# Patient Record
Sex: Female | Born: 2000 | Race: Black or African American | Hispanic: No | Marital: Single | Smoking: Never smoker
Health system: Southern US, Community
[De-identification: ages and names within clinical notes are randomized; demographics above are authoritative.]

## PROBLEM LIST (undated history)

## (undated) ENCOUNTER — Inpatient Hospital Stay (HOSPITAL_COMMUNITY): Payer: BLUE CROSS/BLUE SHIELD

## (undated) ENCOUNTER — Ambulatory Visit (HOSPITAL_COMMUNITY): Source: Home / Self Care

## (undated) DIAGNOSIS — Z973 Presence of spectacles and contact lenses: Secondary | ICD-10-CM

## (undated) DIAGNOSIS — R102 Pelvic and perineal pain unspecified side: Secondary | ICD-10-CM

## (undated) HISTORY — PX: APPENDECTOMY: SHX54

---
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
## 2021-01-09 ENCOUNTER — Emergency Department (HOSPITAL_COMMUNITY): Payer: BC Managed Care – PPO

## 2021-01-09 ENCOUNTER — Other Ambulatory Visit: Payer: Self-pay

## 2021-01-09 ENCOUNTER — Emergency Department (HOSPITAL_COMMUNITY)
Admission: EM | Admit: 2021-01-09 | Discharge: 2021-01-09 | Disposition: A | Discharge status: Home / Self Care | Payer: BC Managed Care – PPO | Attending: Emergency Medicine | Admitting: Emergency Medicine

## 2021-01-09 ENCOUNTER — Encounter (HOSPITAL_COMMUNITY): Payer: Self-pay

## 2021-01-09 DIAGNOSIS — R1110 Vomiting, unspecified: Secondary | ICD-10-CM | POA: Diagnosis not present

## 2021-01-09 DIAGNOSIS — R1084 Generalized abdominal pain: Secondary | ICD-10-CM | POA: Insufficient documentation

## 2021-01-09 DIAGNOSIS — R109 Unspecified abdominal pain: Secondary | ICD-10-CM | POA: Diagnosis not present

## 2021-01-09 DIAGNOSIS — R112 Nausea with vomiting, unspecified: Secondary | ICD-10-CM | POA: Insufficient documentation

## 2021-01-09 LAB — CBC
HCT: 40.6 % (ref 36.0–46.0)
Hemoglobin: 13.2 g/dL (ref 12.0–15.0)
MCH: 27.7 pg (ref 26.0–34.0)
MCHC: 32.5 g/dL (ref 30.0–36.0)
MCV: 85.1 fL (ref 80.0–100.0)
Platelets: 181 10*3/uL (ref 150–400)
RBC: 4.77 MIL/uL (ref 3.87–5.11)
RDW: 14.5 % (ref 11.5–15.5)
WBC: 9.4 10*3/uL (ref 4.0–10.5)
nRBC: 0 % (ref 0.0–0.2)

## 2021-01-09 LAB — URINALYSIS, ROUTINE W REFLEX MICROSCOPIC
Bacteria, UA: NONE SEEN
Bilirubin Urine: NEGATIVE
Glucose, UA: NEGATIVE mg/dL
Hgb urine dipstick: NEGATIVE
Ketones, ur: 20 mg/dL — AB
Leukocytes,Ua: NEGATIVE
Nitrite: NEGATIVE
Protein, ur: 30 mg/dL — AB
Specific Gravity, Urine: 1.03 (ref 1.005–1.030)
pH: 6 (ref 5.0–8.0)

## 2021-01-09 LAB — WET PREP, GENITAL
Sperm: NONE SEEN
Trich, Wet Prep: NONE SEEN
Yeast Wet Prep HPF POC: NONE SEEN

## 2021-01-09 LAB — COMPREHENSIVE METABOLIC PANEL
ALT: 25 U/L (ref 0–44)
AST: 24 U/L (ref 15–41)
Albumin: 4.9 g/dL (ref 3.5–5.0)
Alkaline Phosphatase: 71 U/L (ref 38–126)
Anion gap: 11 (ref 5–15)
BUN: 9 mg/dL (ref 6–20)
CO2: 26 mmol/L (ref 22–32)
Calcium: 10.1 mg/dL (ref 8.9–10.3)
Chloride: 105 mmol/L (ref 98–111)
Creatinine, Ser: 0.82 mg/dL (ref 0.44–1.00)
GFR, Estimated: >60 mL/min (ref >60)
Glucose, Bld: 114 mg/dL — ABNORMAL HIGH (ref 70–99)
Potassium: 3.1 mmol/L — ABNORMAL LOW (ref 3.5–5.1)
Sodium: 142 mmol/L (ref 135–145)
Total Bilirubin: 0.7 mg/dL (ref 0.3–1.2)
Total Protein: 9.1 g/dL — ABNORMAL HIGH (ref 6.5–8.1)

## 2021-01-09 LAB — I-STAT BETA HCG BLOOD, ED (MC, WL, AP ONLY): I-stat hCG, quantitative: <5 m[IU]/mL (ref <5)

## 2021-01-09 LAB — LIPASE, BLOOD: Lipase: 49 U/L (ref 11–51)

## 2021-01-09 MED ORDER — POTASSIUM CHLORIDE CRYS ER 20 MEQ PO TBCR: 20.0000 meq | EXTENDED_RELEASE_TABLET | Freq: Two times a day (BID) | ORAL | 0 refills | Status: DC

## 2021-01-09 MED ORDER — ONDANSETRON HCL 4 MG/2ML IJ SOLN
4.0000 mg | Freq: Once | INTRAMUSCULAR | Status: AC
  Administered 2021-01-09: 4 mg via INTRAVENOUS
  Filled 2021-01-09: qty 2

## 2021-01-09 MED ORDER — SODIUM CHLORIDE 0.9 % IV SOLN
12.5000 mg | Freq: Four times a day (QID) | INTRAVENOUS | Status: DC | PRN
  Administered 2021-01-09: 12.5 mg via INTRAVENOUS
  Filled 2021-01-09: qty 12.5

## 2021-01-09 MED ORDER — NAPROXEN 500 MG PO TABS: 500.0000 mg | ORAL_TABLET | Freq: Two times a day (BID) | ORAL | 0 refills | Status: DC

## 2021-01-09 MED ORDER — SODIUM CHLORIDE 0.9 % IV BOLUS
1000.0000 mL | Freq: Once | INTRAVENOUS | Status: AC
  Administered 2021-01-09: 1000 mL via INTRAVENOUS

## 2021-01-09 MED ORDER — FENTANYL CITRATE (PF) 100 MCG/2ML IJ SOLN
50.0000 ug | Freq: Once | INTRAMUSCULAR | Status: AC
  Administered 2021-01-09: 50 ug via INTRAVENOUS
  Filled 2021-01-09: qty 2

## 2021-01-09 MED ORDER — SODIUM CHLORIDE (PF) 0.9 % IJ SOLN
INTRAMUSCULAR | Status: AC
  Filled 2021-01-09: qty 50

## 2021-01-09 MED ORDER — KETOROLAC TROMETHAMINE 30 MG/ML IJ SOLN
30.0000 mg | Freq: Once | INTRAMUSCULAR | Status: AC
  Administered 2021-01-09: 30 mg via INTRAVENOUS
  Filled 2021-01-09: qty 1

## 2021-01-09 MED ORDER — ONDANSETRON HCL 4 MG PO TABS: 4.0000 mg | ORAL_TABLET | Freq: Four times a day (QID) | ORAL | 0 refills | Status: DC

## 2021-01-09 MED ORDER — IOHEXOL 300 MG/ML  SOLN
100.0000 mL | Freq: Once | INTRAMUSCULAR | Status: AC | PRN
  Administered 2021-01-09: 100 mL via INTRAVENOUS

## 2021-01-09 MED ORDER — LEVONORGESTREL-ETHINYL ESTRAD 0.1-20 MG-MCG PO TABS: 1.0000 | ORAL_TABLET | Freq: Every day | ORAL | 11 refills | Status: DC

## 2021-01-09 NOTE — Discharge Instructions (Addendum)
You were seen today in the emergency department for abdominal pain.    During your work-up, it was discovered that you have low potassium levels.  Although I do not believe that is causing your abdominal pain, I would like you to take potassium supplements for the next 4 days.  Please take 20 mg twice daily for 4 days.    Additionally, for your menstrual pain you can take Motrin as needed.  Please discuss your symptoms with your OB/GYN at your appointment next month.  I have written you a prescription for short of 1 month of oral birth control pills.  Please take them once daily within the same hour every day.  As we discussed, this increases your chance of developing blood clots although that is a very limited increased risk.  Moreover birth control pills may also cause some breast tenderness, menstrual irregularities.   I also have written your prescription for Zofran.  This is a medicine you can take every 6 hours as needed for nausea and vomiting symptoms.  Please take naproxen twice daily for the next 5 days.  Please take it with food and water as this can cause some stomach upset.  Your ultrasound was negative for gallstones.

## 2021-01-09 NOTE — ED Triage Notes (Addendum)
Patient states she has noted that she has vomiting and abdominal pain when her period starts. Patient states that she just finished her period on June 18. Patient states she is now having mid abdominal pain RLQ pain today. Patient states she has never had the pain in the RLQ. Patient also reports multiple episodes of vomiting today.

## 2021-01-09 NOTE — ED Provider Notes (Signed)
[facility]-EMERGENCY DEPT Provider Note   CSN: [id number] Arrival date & time: 01/09/21  1145     History Chief Complaint  Patient presents with   Abdominal Pain   Emesis    Sarah Cabrera is a 19 y.o. female with pertinent past medical history of appendectomy that presents to the emergency department today for abdominal pain and nausea vomiting for the last two moths, worse over the last several days. Patient states that she started having severe nausea vomiting every time her period starts for the past 4 cycles.  Patient states that her cycles last about 5 to 6 days, denies any heavy bleeding.  Patient states that she goes through about 6 tampons in a day.  Patient states that she ended her menstrual cycle on the 18th of this month, however still having the nausea vomiting and abdominal pain.  Patient states that she normally has a nausea vomiting abdominal pain during and after her menstrual cycle for the last 2 months.  She is not currently on her menstrual cycle.   States that normally when she has this pain is generalized in her abdomen, today she says that she started having right lower quadrant pain for 2 seconds..  States that right lower quadrant pain has gone away, only lasted 2 seconds, was sharp.  Did not radiate anywhere.  No vaginal bleeding.  Patient has had an appendectomy before.  States that she is currently on her period, has never had an ultrasound.  Denies any GYN history.  Patient denies any vaginal discharge or pelvic pain.  States that she has not been able to keep anything down besides smoothies over the past 3 days.  Patient states that she has been having 2 menstrual cycles a month for the past couple months.  Is not on any birth control.  Denies any diarrhea, fevers, chest pain shortness of breath, headache or myalgias.  Denies any alcohol or substance use.  HPI     History reviewed. No pertinent past medical history.  There are no problems to  display for this patient.   Past Surgical History:  Procedure Laterality Date   APPENDECTOMY       OB History   No obstetric history on file.     Family History  Problem Relation Age of Onset   Healthy Mother    Cancer Father     Social History   Tobacco Use   Smoking status: Never   Smokeless tobacco: Never  Vaping Use   Vaping Use: Never used  Substance Use Topics   Alcohol use: Never   Drug use: Yes    Types: Marijuana    Home Medications Prior to Admission medications   Not on File    Allergies    Patient has no known allergies.  Review of Systems   Review of Systems  Constitutional:  Negative for chills, diaphoresis, fatigue and fever.  HENT:  Negative for congestion, sore throat and trouble swallowing.   Eyes:  Negative for pain and visual disturbance.  Respiratory:  Negative for cough, shortness of breath and wheezing.   Cardiovascular:  Negative for chest pain, palpitations and leg swelling.  Gastrointestinal:  Positive for abdominal pain, nausea and vomiting. Negative for abdominal distention and diarrhea.  Genitourinary:  Negative for difficulty urinating.  Musculoskeletal:  Negative for back pain, neck pain and neck stiffness.  Skin:  Negative for pallor.  Neurological:  Negative for dizziness, speech difficulty, weakness and headaches.  Psychiatric/Behavioral:  Negative  for confusion.    Physical Exam Updated Vital Signs BP (!) 139/110   Pulse 85   Temp 98.4 F (36.9 C) (Oral)   Resp 18   Ht 5\' 6"  (1.676 m)   Wt 70.8 kg   LMP 01/03/2021   SpO2 100%   BMI 25.18 kg/m   Physical Exam Constitutional:      General: She is not in acute distress.    Appearance: Normal appearance. She is not ill-appearing, toxic-appearing or diaphoretic.     Comments: Patient appears very well, no vomiting  HENT:     Mouth/Throat:     Mouth: Mucous membranes are moist.     Pharynx: Oropharynx is clear.  Eyes:     General: No scleral icterus.     Extraocular Movements: Extraocular movements intact.     Pupils: Pupils are equal, round, and reactive to light.  Cardiovascular:     Rate and Rhythm: Normal rate and regular rhythm.     Pulses: Normal pulses.     Heart sounds: Normal heart sounds.  Pulmonary:     Effort: Pulmonary effort is normal. No respiratory distress.     Breath sounds: Normal breath sounds. No stridor. No wheezing, rhonchi or rales.  Chest:     Chest wall: No tenderness.  Abdominal:     General: Abdomen is flat. There is no distension.     Palpations: Abdomen is soft.     Tenderness: There is generalized abdominal tenderness. There is no guarding or rebound.  Genitourinary:    Comments: Chaperone present.  Normal external vaginal exam.  Normal internal vaginal exam with normal vaginal canal, cervix closed, no vaginal discharge or vaginal bleeding.  Nonfriable cervix.  Bimanual exam with no adnexal or cervical motion tenderness. Musculoskeletal:        General: No swelling or tenderness. Normal range of motion.     Cervical back: Normal range of motion and neck supple. No rigidity.     Right lower leg: No edema.     Left lower leg: No edema.  Skin:    General: Skin is warm and dry.     Capillary Refill: Capillary refill takes less than 2 seconds.     Coloration: Skin is not pale.  Neurological:     General: No focal deficit present.     Mental Status: She is alert and oriented to person, place, and time.  Psychiatric:        Mood and Affect: Mood normal.        Behavior: Behavior normal.    ED Results / Procedures / Treatments   Labs (all labs ordered are listed, but only abnormal results are displayed) Labs Reviewed  WET PREP, GENITAL - Abnormal; Notable for the following components:      Result Value   Clue Cells Wet Prep HPF POC PRESENT (*)    WBC, Wet Prep HPF POC RARE (*)    All other components within normal limits  COMPREHENSIVE METABOLIC PANEL - Abnormal; Notable for the following components:    Potassium 3.1 (*)    Glucose, Bld 114 (*)    Total Protein 9.1 (*)    All other components within normal limits  URINALYSIS, ROUTINE W REFLEX MICROSCOPIC - Abnormal; Notable for the following components:   Color, Urine AMBER (*)    APPearance HAZY (*)    Ketones, ur 20 (*)    Protein, ur 30 (*)    All other components within normal limits  LIPASE, BLOOD  CBC  I-STAT BETA HCG BLOOD, ED ([facility], [facility], [facility] ONLY)  GC/CHLAMYDIA PROBE AMP ([place]) NOT AT [facility]    EKG None  Radiology No results found.  Procedures Procedures   Medications Ordered in ED Medications  promethazine (PHENERGAN) 12.5 mg in sodium chloride 0.9 % 50 mL IVPB (has no administration in time range)  sodium chloride 0.9 % bolus 1,000 mL (1,000 mLs Intravenous New Bag/Given 01/09/21 1416)  ondansetron (ZOFRAN) injection 4 mg (4 mg Intravenous Given 01/09/21 1417)  fentaNYL (SUBLIMAZE) injection 50 mcg (50 mcg Intravenous Given 01/09/21 1547)    ED Course  I have reviewed the triage vital signs and the nursing notes.  Pertinent labs & imaging results that were available during my care of the patient were reviewed by me and considered in my medical decision making (see chart for details).    MDM Rules/Calculators/A&P                         Patient presents to the emerge department today for nausea vomiting abdominal pain.  Patient appears well, no right lower quadrant tenderness.  Low suspicion for ovarian torsion since pain only lasted a couple seconds and patient is not having any pain anymore.  Patient's pain was primarily generalized throughout abdomen.  Was having generalized abdominal pain nausea vomiting prior to right lower quadrant pain.  Patient is not having vaginal bleeding with normal pelvic exam, no concerns for PID.  Patient appears very well, is not actively vomiting.  Differential to include endometriosis, pain related to menstrual cycle, urolithiasis?  I think CT is reasonable at this time since this  has been ongoing for 2 months.   Work-up today shows CMP with potassium of 3.1, will repeat this once patient is able to tolerate p.o.  Lipase normal.  CBC without any leukocytosis or anemia.  I-STAT beta-hCG negative.  Urinalysis without any blood.  Pt care was handed off to H. Sage PA-C at 330.  Complete history and physical and current plan have been communicated.  Please refer to their note for the remainder of ED care and ultimate disposition.  Plan is for patient to obtain CT abdomen pelvis.  Also awaiting wet prep results.  Consider ultrasound if right lower quadrant pain returns?  if this is negative patient can be discharged if she is tolerating p.o.  When patient is able to tolerate p.o. potassium should be repleted.  I would also prescribe potassium for the next couple of days.  Patient will need to follow-up with GYN most likely.   Final Clinical Impression(s) / ED Diagnoses Final diagnoses:  Intractable vomiting with nausea, unspecified vomiting type    Rx / DC Orders ED Discharge Orders     None        Farrel Gordon, PA-C 01/09/21 1555    Alvira Monday, MD 01/09/21 2146

## 2021-01-09 NOTE — ED Provider Notes (Signed)
Care of patient is assumed from [facility] at shift change - please see her note for full details. She is presenting for RLQ pain, S/P appendectomy. Pain appears related to her menstrual cycles. She is not on birth control. CT abdomen/pelvis pending.   Physical Exam  BP (!) 139/110   Pulse 85   Temp 98.4 F (36.9 C) (Oral)   Resp 18   Ht 5\' 6"  (1.676 m)   Wt 70.8 kg   LMP 01/03/2021   SpO2 100%   BMI 25.18 kg/m   Physical Exam Vitals and nursing note reviewed. Exam conducted with a chaperone present.  Constitutional:      General: She is not in acute distress.    Appearance: Normal appearance.     Comments: Patient tearful.   HENT:     Head: Normocephalic and atraumatic.  Eyes:     General: No scleral icterus.    Extraocular Movements: Extraocular movements intact.     Pupils: Pupils are equal, round, and reactive to light.  Abdominal:     Tenderness: There is generalized abdominal tenderness. There is no guarding.  Skin:    Coloration: Skin is not jaundiced.  Neurological:     Mental Status: She is alert. Mental status is at baseline.     Coordination: Coordination normal.    ED Course/Procedures   Clinical Course as of 01/09/21 1905  Fri Jan 09, 2021  1707 Patient passed PO test. She reports pain has returned and is requesting pain medicine. [HS]  1808 Pain returned. Given that pain is insighted by eating/drinking I have ordered ultrasound to assess for gallstones.  I doubt cholecystitis given no fever, no elevated white count, no specific right upper quadrant pain.  However, could be cholelithiasis given intermittent nature of the pain.  If this results negative patient will be appropriate for discharge. [HS]  1855 Jan 11, 2021 Abdomen Limited No cholelithiasis  [HS]    Clinical Course User Index [HS] [place], PA-C    Procedures  MDM  Patient is a 20 y.o s/p appendectomy presenting for diffuse abdominal pain. Workup has been reassuring. No elevated white count  concerning for infectious etiology, no anemia. Lipase low, doubt pancreatitis. Not UTI based on U. GC/Chlamydia pending. Beta hCG low, doubt ectopic pregnancy. Wet prep notable for clue cells. Based on pelvic, not PID. I suspect this is menstrual cramping pain vs. Endometriosis vs. Ovarian cyst. Will give patient referral to OBGYN. She has an appointment with her PCP in July. Discussed starting her on birth control and she desires to start. I discussed risks, side effects, and efficacy with her. She is agreeable to starting it.   She reports having a sudden onset of more pain after eating and drinking.  She states that although her pain is primarily related to her menstrual cycles, she does seem to have increased pain, nausea, vomiting after eating and drinking.  She does have diffuse abdominal tenderness, so I have lower suspicion for Coley lithiasis.  She also does not have a fever or elevated white count concerning for cholecystitis.  However, patient and her mother were very concerned about her pain level so I will order the limited ultrasound.  Depending on this result, I suspect she will be able to be discharged home with follow-up.  She is hypokalemic to 3.1, will treat out patient with oral k-dur.   Right upper quadrant ultrasound ordered to evaluate for cholecystitis.  It resulted negative for cholelithiasis.  Patient will work-up today, I will  discharge patient with follow-up.  She already has an appoint with an OBG next month.  We discussed return precautions and patient is agreeable to discharge.  Her pain is resolved while in the ED today.   Theron Arista, PA-C 01/09/21 1907    Little, Ambrose [place], MD 01/10/21 [phone]

## 2021-01-10 ENCOUNTER — Emergency Department (HOSPITAL_COMMUNITY)
Admission: EM | Admit: 2021-01-10 | Discharge: 2021-01-10 | Disposition: A | Discharge status: Home / Self Care | Payer: BC Managed Care – PPO | Attending: Emergency Medicine | Admitting: Emergency Medicine

## 2021-01-10 ENCOUNTER — Encounter (HOSPITAL_COMMUNITY): Payer: Self-pay | Admitting: Emergency Medicine

## 2021-01-10 ENCOUNTER — Emergency Department (HOSPITAL_COMMUNITY): Payer: BC Managed Care – PPO

## 2021-01-10 DIAGNOSIS — E876 Hypokalemia: Secondary | ICD-10-CM | POA: Insufficient documentation

## 2021-01-10 DIAGNOSIS — R102 Pelvic and perineal pain: Secondary | ICD-10-CM | POA: Diagnosis not present

## 2021-01-10 DIAGNOSIS — R112 Nausea with vomiting, unspecified: Secondary | ICD-10-CM | POA: Insufficient documentation

## 2021-01-10 DIAGNOSIS — N760 Acute vaginitis: Secondary | ICD-10-CM | POA: Diagnosis not present

## 2021-01-10 DIAGNOSIS — R109 Unspecified abdominal pain: Secondary | ICD-10-CM | POA: Diagnosis not present

## 2021-01-10 DIAGNOSIS — B9689 Other specified bacterial agents as the cause of diseases classified elsewhere: Secondary | ICD-10-CM | POA: Insufficient documentation

## 2021-01-10 LAB — CBC WITH DIFFERENTIAL/PLATELET
Abs Immature Granulocytes: 0.03 10*3/uL (ref 0.00–0.07)
Basophils Absolute: 0 10*3/uL (ref 0.0–0.1)
Basophils Relative: 0 %
Eosinophils Absolute: 0 10*3/uL (ref 0.0–0.5)
Eosinophils Relative: 0 %
HCT: 37.4 % (ref 36.0–46.0)
Hemoglobin: 12.1 g/dL (ref 12.0–15.0)
Immature Granulocytes: 0 %
Lymphocytes Relative: 19 %
Lymphs Abs: 2.1 10*3/uL (ref 0.7–4.0)
MCH: 27.3 pg (ref 26.0–34.0)
MCHC: 32.4 g/dL (ref 30.0–36.0)
MCV: 84.2 fL (ref 80.0–100.0)
Monocytes Absolute: 0.7 10*3/uL (ref 0.1–1.0)
Monocytes Relative: 6 %
Neutro Abs: 8.4 10*3/uL — ABNORMAL HIGH (ref 1.7–7.7)
Neutrophils Relative %: 75 %
Platelets: 166 10*3/uL (ref 150–400)
RBC: 4.44 MIL/uL (ref 3.87–5.11)
RDW: 14.3 % (ref 11.5–15.5)
WBC: 11.4 10*3/uL — ABNORMAL HIGH (ref 4.0–10.5)
nRBC: 0 % (ref 0.0–0.2)

## 2021-01-10 LAB — COMPREHENSIVE METABOLIC PANEL
ALT: 22 U/L (ref 0–44)
AST: 23 U/L (ref 15–41)
Albumin: 4 g/dL (ref 3.5–5.0)
Alkaline Phosphatase: 63 U/L (ref 38–126)
Anion gap: 13 (ref 5–15)
BUN: 8 mg/dL (ref 6–20)
CO2: 20 mmol/L — ABNORMAL LOW (ref 22–32)
Calcium: 9.6 mg/dL (ref 8.9–10.3)
Chloride: 106 mmol/L (ref 98–111)
Creatinine, Ser: 0.96 mg/dL (ref 0.44–1.00)
GFR, Estimated: >60 mL/min (ref >60)
Glucose, Bld: 91 mg/dL (ref 70–99)
Potassium: 3.3 mmol/L — ABNORMAL LOW (ref 3.5–5.1)
Sodium: 139 mmol/L (ref 135–145)
Total Bilirubin: 0.8 mg/dL (ref 0.3–1.2)
Total Protein: 7.6 g/dL (ref 6.5–8.1)

## 2021-01-10 LAB — I-STAT BETA HCG BLOOD, ED (MC, WL, AP ONLY): I-stat hCG, quantitative: <5 m[IU]/mL (ref <5)

## 2021-01-10 LAB — LIPASE, BLOOD: Lipase: 53 U/L — ABNORMAL HIGH (ref 11–51)

## 2021-01-10 MED ORDER — PROMETHAZINE HCL 25 MG RE SUPP: 25.0000 mg | Freq: Four times a day (QID) | RECTAL | 0 refills | Status: DC | PRN

## 2021-01-10 MED ORDER — SODIUM CHLORIDE 0.9 % IV BOLUS
1000.0000 mL | Freq: Once | INTRAVENOUS | Status: AC
  Administered 2021-01-10: 1000 mL via INTRAVENOUS

## 2021-01-10 MED ORDER — METRONIDAZOLE 500 MG PO TABS: 500.0000 mg | ORAL_TABLET | Freq: Two times a day (BID) | ORAL | 0 refills | Status: DC

## 2021-01-10 MED ORDER — MORPHINE SULFATE (PF) 4 MG/ML IV SOLN: 4.0000 mg | Freq: Once | INTRAVENOUS | Status: DC

## 2021-01-10 MED ORDER — ONDANSETRON 4 MG PO TBDP: 4.0000 mg | ORAL_TABLET | Freq: Three times a day (TID) | ORAL | 0 refills | Status: DC | PRN

## 2021-01-10 MED ORDER — MORPHINE SULFATE (PF) 4 MG/ML IV SOLN
4.0000 mg | Freq: Once | INTRAVENOUS | Status: AC
  Administered 2021-01-10: 4 mg via INTRAVENOUS
  Filled 2021-01-10: qty 1

## 2021-01-10 MED ORDER — ONDANSETRON HCL 4 MG/2ML IJ SOLN
4.0000 mg | Freq: Once | INTRAMUSCULAR | Status: AC
  Administered 2021-01-10: 4 mg via INTRAVENOUS
  Filled 2021-01-10: qty 2

## 2021-01-10 MED ORDER — DROPERIDOL 2.5 MG/ML IJ SOLN: 2.5000 mg | Freq: Once | INTRAMUSCULAR | Status: DC

## 2021-01-10 MED ORDER — KETOROLAC TROMETHAMINE 30 MG/ML IJ SOLN
15.0000 mg | Freq: Once | INTRAMUSCULAR | Status: AC
  Administered 2021-01-10: 15 mg via INTRAVENOUS
  Filled 2021-01-10: qty 1

## 2021-01-10 NOTE — ED Notes (Signed)
Pt tolerated PO challenge

## 2021-01-10 NOTE — ED Notes (Signed)
Pt reports ongoing lower abd pain described as cramping pain and light she has to have a BM. Last period 6/12 ending on the 18th. Pt appears uncomfortable in the bed. Pt reports not being able to keep food down and that when she is able, it makes the pain worse. Pt endorses smoking weed 2 days ago.

## 2021-01-10 NOTE — Discharge Instructions (Addendum)
Abdominal pain, Nausea, and Vomiting  Hand washing: Wash your hands throughout the day, but especially before and after touching the face, using the restroom, sneezing, coughing, or touching surfaces that have been coughed or sneezed upon. Hydration: Symptoms will be intensified and complicated by dehydration. Dehydration can also extend the duration of symptoms. Drink plenty of fluids and get plenty of rest. You should be drinking at least half a liter of water an hour to stay hydrated. Electrolyte drinks (ex. Gatorade, Powerade, Pedialyte) are also encouraged. You should be drinking enough fluids to make your urine light yellow, almost clear. If this is not the case, you are not drinking enough water. Please note that some of the treatments indicated below will not be effective if you are not adequately hydrated. Diet: Please concentrate on hydration, however, you may introduce food slowly.  Start with a clear liquid diet, progressed to a full liquid diet, and then bland solids as you are able. Pain or fever: Ibuprofen, Naproxen, or Tylenol for pain.  Do this regimen now, but may also start this regimen just prior to onset of your menstrual cycle. Antiinflammatory medications: Take 600 mg of ibuprofen every 6 hours or 440 mg (over the counter dose) to 500 mg (prescription dose) of naproxen every 12 hours for the next 3 days. After this time, these medications may be used as needed for pain. Take these medications with food to avoid upset stomach. Choose only one of these medications, do not take them together. Acetaminophen (generic for Tylenol): Should you continue to have additional pain while taking the ibuprofen or naproxen, you may add in acetaminophen as needed. Your daily total maximum amount of acetaminophen from all sources should be limited to 4000mg /day for persons without liver problems, or 2000mg /day for those with liver problems. Nausea/vomiting: Use the ondansetron (generic for Zofran) for  nausea or vomiting.  This medication may not prevent all vomiting or nausea, but can help facilitate better hydration. Things that can help with nausea/vomiting also include peppermint/menthol candies, vitamin B12, and ginger. Nausea/vomiting: As an alternative to the ondansetron, you may use the promethazine (generic for Phenergan) for nausea or vomiting.  This medication may not prevent all vomiting or nausea, but can help facilitate better hydration. Things that can help with nausea/vomiting also include peppermint/menthol candies, vitamin B12, and ginger. Follow-up: The highest yield may be with OB/GYN follow-up.  Call to make an appointment. Return: Return should you develop a fever, bloody diarrhea, increased abdominal pain, vaginal bleeding soaking more than 1 pad an hour, uncontrolled vomiting, or any other major concerns.  For prescription assistance, may try using prescription discount sites or apps, such as goodrx.com

## 2021-01-10 NOTE — ED Notes (Signed)
Pt transported to [facility] at this time. 

## 2021-01-10 NOTE — ED Notes (Signed)
Reviewed discharge instructions with patient and father. Follow-up care, discharge instructions and medications reviewed. Patient and father verbalized understanding. Patient A&Ox4, VSS, and ambulatory with steady gait upon discharge.

## 2021-01-10 NOTE — ED Triage Notes (Signed)
C/o mid abdominal pain with nausea, vomiting, and constipation since yesterday.  Seen in ED at [facility] yesterday for same.

## 2021-01-10 NOTE — ED Notes (Signed)
Pt screaming and crying. Pt's father came to nurses station asking for pain meds. Notified provider.

## 2021-01-10 NOTE — ED Provider Notes (Signed)
[facility] EMERGENCY DEPARTMENT Provider Note   CSN: [id number] Arrival date & time: 01/10/21  0755    History Abd pain, emesis   Sarah Cabrera is a 20 y.o. female with no significant past medical history who presents for evaluation of nausea, vomiting and abdominal pain.  Seen yesterday for similar complaints.  Had CT scan as well as ultrasound right upper quadrant.  Patient states pain is generalized however worse at suprapubic region. No vag discharge. Has never been sexually active. No dysuria, hematuria. Sent home yesterday with Naproxen, potassium and Zofran. Continues to have multiple episodes of NBNB emesis. She feels constipated. Unsure last BM however states when the last time she went it was only a "few little balls." No melena or BRBPR. Typically does get emesis with menstrual cycles however last LMP 01/03/21. No EtOH, chronic NSAID use.  She does admit to frequent marijuana use, last used 2 days ago. No additional illicit substances. Rates her pain a 9/10.  Last episode of emesis 1 hour PTA.  Prior abd surgeries>> Appy at age 58. No hx of SBO.   Patient here with Father in room. Gives permission to obtain HPI, exam and discuss results with family in room.  History obtained from patient, family in room and past medical records. No interpretor was used.  HPI     History reviewed. No pertinent past medical history.  There are no problems to display for this patient.   Past Surgical History:  Procedure Laterality Date   APPENDECTOMY       OB History   No obstetric history on file.     Family History  Problem Relation Age of Onset   Healthy Mother    Cancer Father     Social History   Tobacco Use   Smoking status: Never   Smokeless tobacco: Never  Vaping Use   Vaping Use: Never used  Substance Use Topics   Alcohol use: Never   Drug use: Yes    Types: Marijuana    Home Medications Prior to Admission medications   Medication Sig  Start Date End Date Taking? Authorizing Provider  metroNIDAZOLE (FLAGYL) 500 MG tablet Take 1 tablet (500 mg total) by mouth 2 (two) times daily. 01/10/21  Yes Normagene Harvie A, PA-C  naproxen (NAPROSYN) 500 MG tablet Take 1 tablet (500 mg total) by mouth 2 (two) times daily. 01/09/21  Yes Theron Arista, PA-C  ondansetron (ZOFRAN) 4 MG tablet Take 1 tablet (4 mg total) by mouth every 6 (six) hours. 01/09/21  Yes Theron Arista, PA-C  potassium chloride SA (KLOR-CON) 20 MEQ tablet Take 1 tablet (20 mEq total) by mouth 2 (two) times daily. 01/09/21  Yes Theron Arista, PA-C  levonorgestrel-ethinyl estradiol (ALESSE) 0.1-20 MG-MCG tablet Take 1 tablet by mouth daily. Patient not taking: No sig reported 01/09/21   Theron Arista, PA-C    Allergies    Patient has no known allergies.  Review of Systems   Review of Systems  Constitutional: Negative.   HENT: Negative.    Respiratory: Negative.    Cardiovascular: Negative.   Gastrointestinal:  Positive for abdominal pain, constipation, nausea and vomiting. Negative for abdominal distention, anal bleeding, blood in stool, diarrhea and rectal pain.  Genitourinary: Negative.   Musculoskeletal: Negative.   Skin: Negative.   Neurological: Negative.   All other systems reviewed and are negative.  Physical Exam Updated Vital Signs BP 134/81   Pulse 67   Temp 98.2 F (36.8 C) (Oral)   Resp  15   LMP 12/28/2020 (Exact Date)   SpO2 100%   Physical Exam Vitals and nursing note reviewed.  Constitutional:      General: She is not in acute distress.    Appearance: She is well-developed. She is not ill-appearing, toxic-appearing or diaphoretic.     Comments: Tearful in room  HENT:     Head: Normocephalic and atraumatic.     Mouth/Throat:     Mouth: Mucous membranes are moist.  Eyes:     Pupils: Pupils are equal, round, and reactive to light.  Cardiovascular:     Rate and Rhythm: Normal rate.     Pulses: Normal pulses.     Heart sounds: Normal heart  sounds.  Pulmonary:     Effort: Pulmonary effort is normal. No respiratory distress.     Breath sounds: Normal breath sounds.  Abdominal:     General: There is no distension.     Tenderness: There is abdominal tenderness in the suprapubic area.     Hernia: No hernia is present.     Comments: Diffuse tenderness to abdomen worse at suprapubic region  Musculoskeletal:        General: Normal range of motion.     Cervical back: Normal range of motion.  Skin:    General: Skin is warm and dry.  Neurological:     General: No focal deficit present.     Mental Status: She is alert.  Psychiatric:        Mood and Affect: Mood normal.    ED Results / Procedures / Treatments   Labs (all labs ordered are listed, but only abnormal results are displayed) Labs Reviewed  CBC WITH DIFFERENTIAL/PLATELET - Abnormal; Notable for the following components:      Result Value   WBC 11.4 (*)    Neutro Abs 8.4 (*)    All other components within normal limits  COMPREHENSIVE METABOLIC PANEL - Abnormal; Notable for the following components:   Potassium 3.3 (*)    CO2 20 (*)    All other components within normal limits  LIPASE, BLOOD - Abnormal; Notable for the following components:   Lipase 53 (*)    All other components within normal limits  URINE CULTURE  URINALYSIS, ROUTINE W REFLEX MICROSCOPIC  RAPID URINE DRUG SCREEN, HOSP PERFORMED  I-STAT BETA HCG BLOOD, ED ([facility], [facility], [facility] ONLY)    EKG None  Radiology CT Abdomen Pelvis W Contrast  Result Date: 01/09/2021 CLINICAL DATA:  Right lower quadrant abdominal pain.  Vomiting. EXAM: CT ABDOMEN AND PELVIS WITH CONTRAST TECHNIQUE: Multidetector CT imaging of the abdomen and pelvis was performed using the standard protocol following bolus administration of intravenous contrast. CONTRAST:  100mL OMNIPAQUE IOHEXOL 300 MG/ML  SOLN COMPARISON:  None. FINDINGS: Lower chest: Clear lung bases. Hepatobiliary: No focal liver abnormality is seen. No gallstones,  gallbladder wall thickening, or biliary dilatation. Pancreas: Unremarkable. No pancreatic ductal dilatation or surrounding inflammatory changes. Spleen: Normal in size without focal abnormality. Adrenals/Urinary Tract: Adrenal glands are unremarkable. Kidneys are normal, without renal calculi, focal lesion, or hydronephrosis. Bladder is unremarkable. Stomach/Bowel: Normal stomach. Small bowel and colon are normal in caliber. No wall thickening. No inflammation. Appendix not discretely seen. No findings to suggest acute appendicitis. Vascular/Lymphatic: No significant vascular findings are present. No enlarged abdominal or pelvic lymph nodes. Reproductive: Uterus and bilateral adnexa are unremarkable. Other: None. Musculoskeletal: No acute or significant osseous findings. IMPRESSION: 1. Normal enhanced CT scan of the abdomen pelvis. No findings to account  for the patient's pain. Normal appendix not discretely seen, but there is no evidence to suggest acute appendicitis. Electronically Signed   By: Amie Portland M.D.   On: 01/09/2021 16:48   US Abdomen Limited  Result Date: 01/09/2021 CLINICAL DATA:  Nausea vomiting after eating EXAM: ULTRASOUND ABDOMEN LIMITED RIGHT UPPER QUADRANT COMPARISON:  None. FINDINGS: Gallbladder: No gallstones or wall thickening visualized. No sonographic Murphy sign noted by sonographer. Common bile duct: Diameter: 3 mm Liver: No focal lesion identified. Within normal limits in parenchymal echogenicity. Portal vein is patent on color Doppler imaging with normal direction of blood flow towards the liver. Other: None. IMPRESSION: Negative right upper quadrant abdominal ultrasound Electronically Signed   By: Jasmine Pang M.D.   On: 01/09/2021 18:51   DG Abd Portable 1V  Result Date: 01/10/2021 CLINICAL DATA:  Severe abdominal pain and vomiting for 2 days EXAM: PORTABLE ABDOMEN - 1 VIEW COMPARISON:  None. FINDINGS: The bowel gas pattern is normal. No radio-opaque calculi or other  significant radiographic abnormality are seen. IMPRESSION: Nonobstructive pattern of bowel gas. No free air in the abdomen on supine radiograph. Electronically Signed   By: Lauralyn Primes M.D.   On: 01/10/2021 13:39   US PELVIC COMPLETE W TRANSVAGINAL AND TORSION R/O  Result Date: 01/10/2021 CLINICAL DATA:  Bilateral pelvic pain for 1 month. EXAM: TRANSABDOMINAL AND TRANSVAGINAL ULTRASOUND OF PELVIS DOPPLER ULTRASOUND OF OVARIES TECHNIQUE: Both transabdominal and transvaginal ultrasound examinations of the pelvis were performed. Transabdominal technique was performed for global imaging of the pelvis including uterus, ovaries, adnexal regions, and pelvic cul-de-sac. It was necessary to proceed with endovaginal exam following the transabdominal exam to visualize the LEFT and RIGHT ovary. Color and duplex Doppler ultrasound was utilized to evaluate blood flow to the ovaries. COMPARISON:  CT abdomen and pelvis from January 09, 2021. FINDINGS: Uterus Measurements: 8.0 x 4.2 x 4.0 cm = volume: 70.9 mL. Retroverted uterus without focal abnormality. Endometrium Thickness: 12 mm.  No focal abnormality visualized. Right ovary Measurements: 3.3 x 1.6 x 2.6 cm = volume: 7.3 mL. Normal appearance/no adnexal mass. Left ovary Measurements: 2.6 x 2.0 x 1.9 cm = volume: 5.1 mL. Normal appearance/no adnexal mass. Pulsed Doppler evaluation of both ovaries demonstrates normal low-resistance arterial and venous waveforms. Other findings Trace free pelvic fluid. IMPRESSION: Unremarkable pelvic sonogram without signs of ovarian torsion. Electronically Signed   By: Donzetta Kohut M.D.   On: 01/10/2021 13:07    Procedures Procedures   Medications Ordered in ED Medications  droperidol (INAPSINE) 2.5 MG/ML injection 2.5 mg (2.5 mg Intramuscular Not Given 01/10/21 1512)  sodium chloride 0.9 % bolus 1,000 mL (0 mLs Intravenous Stopped 01/10/21 1131)  ondansetron (ZOFRAN) injection 4 mg (4 mg Intravenous Given 01/10/21 0924)  morphine 4  MG/ML injection 4 mg (4 mg Intravenous Given 01/10/21 0272)    ED Course  I have reviewed the triage vital signs and the nursing notes.  Pertinent labs & imaging results that were available during my care of the patient were reviewed by me and considered in my medical decision making (see chart for details).  Here for evaluation of N/V/ and abd pain. Seen yesterday for similar complaints.  I reviewed her CT AP and RUQ [place] from yesterday which did not show any significant abnormality.  Had some mild hypokalemia sent home on potassium supplementation.  Continued pain despite naproxen and Zofran at home.  Patient very tearful in room.  She has diffuse abdominal tenderness however worse to suprapubic region.  Notes from yesterday showed pelvic exam without CMT or adnexal tenderness. Wet prep with BV>>> per chart review not sent home on Flagyl. Will need abx at dc.  Plan to repeat labs, treat symptomatically, obtain ultrasound, xray abd and reassess.  Labs and imaging personally reviewed and interpreted:  0850: Patient was take to [place] prior to IVF and pain meds given. Unfortunately not able to obtain imaging due to empty bladder, pain and persistent emesis. Nursing made aware, will be given previously ordered meds and let us known when able to complete the imaging.  1020: Patient with continued pain, multiple episodes of NBNB emesis. Droperidol ordered.  1200: Patient reassessed. Sleeping. Nursing had not given Droperidol yet as patient was sleeping on reassessment. Will plan on [place] and hold droperidol unless pain, emesis returns.  Ultrasound negative for acute process, no torsion.  Declined repeat GU exam as states she had this yesterday. X-ray negative for SBO, no constipation.  1500: Patient reassessed. Has not needed any additional pain medicine or antiemetics since her initial dose.  She was sleeping on reassessment arousable to voice.  States she feels much improved.  Abdomen soft, nontender without  rebound or guarding.  Plan on p.o. challenge.  Care transferred to [facility], PA-C who will follow up on p.o. challenge, reassessment and determine dipo.  If patient still has emesis after PO challenge would suggest additional antiemetic, reevaluate abdominal exam.  If still has persistent emesis, intractable pain would recommend admission at that time.      MDM Rules/Calculators/A&P                           Final Clinical Impression(s) / ED Diagnoses Final diagnoses:  Pelvic pain  Bacterial vaginosis    Rx / DC Orders ED Discharge Orders          Ordered    metroNIDAZOLE (FLAGYL) 500 MG tablet  2 times daily        01/10/21 0826             Brenn Deziel A, PA-C 01/10/21 1540    Jacalyn Lefevre, MD 01/11/21 [phone]

## 2021-01-10 NOTE — ED Notes (Signed)
Pt sleeping at this time.

## 2021-01-10 NOTE — ED Provider Notes (Signed)
Sarah Cabrera is a 20 y.o. female, presenting to the ED with suprapubic abdominal pain intermittent over the last couple months, accompanied by nausea and vomiting. Patient states the pain seems to begin surrounding her menstrual cycles and nausea/vomiting follows the pain.  She endorses cramping in the suprapubic region, intermittent, moderate to severe, nonradiating.  Patient and her father at the bedside detail patient has not tried any interventions or medications for her symptoms other than 1 dose of naproxen she took this morning and then vomited shortly thereafter.  Via phone, mother details family history of fibroids.   HPI from [facility], PA-C: "Sarah Cabrera is a 20 y.o. female with no significant past medical history who presents for evaluation of nausea, vomiting and abdominal pain.  Seen yesterday for similar complaints.  Had CT scan as well as ultrasound right upper quadrant.  Patient states pain is generalized however worse at suprapubic region. No vag discharge. Has never been sexually active. No dysuria, hematuria. Sent home yesterday with Naproxen, potassium and Zofran. Continues to have multiple episodes of NBNB emesis. She feels constipated. Unsure last BM however states when the last time she went it was only a "few little balls." No melena or BRBPR. Typically does get emesis with menstrual cycles however last LMP 01/03/21. No EtOH, chronic NSAID use.  She does admit to frequent marijuana use, last used 2 days ago. No additional illicit substances. Rates her pain a 9/10.   Last episode of emesis 1 hour PTA.   Prior abd surgeries>> Appy at age 29. No hx of SBO.     Patient here with Father in room. Gives permission to obtain HPI, exam and discuss results with family in room."  History reviewed. No pertinent past medical history.  Physical Exam  BP 113/77   Pulse 61   Temp 98.2 F (36.8 C) (Oral)   Resp 14   LMP 12/28/2020 (Exact Date)   SpO2 100%   Physical  Exam Vitals and nursing note reviewed.  Constitutional:      General: She is not in acute distress.    Appearance: She is well-developed. She is not diaphoretic.     Comments: Intermittently appears uncomfortable.  Will sometimes sit up and back, screaming, yelling, and then suddenly calms.  HENT:     Head: Normocephalic and atraumatic.     Mouth/Throat:     Mouth: Mucous membranes are moist.     Pharynx: Oropharynx is clear.  Eyes:     Conjunctiva/sclera: Conjunctivae normal.  Cardiovascular:     Rate and Rhythm: Normal rate and regular rhythm.     Pulses: Normal pulses.  Pulmonary:     Effort: Pulmonary effort is normal. No respiratory distress.  Abdominal:     Palpations: Abdomen is soft.     Tenderness: There is abdominal tenderness in the suprapubic area. There is no guarding.  Musculoskeletal:     Cervical back: Neck supple.  Skin:    General: Skin is warm and dry.  Neurological:     Mental Status: She is alert.  Psychiatric:        Mood and Affect: Mood and affect normal.        Speech: Speech normal.        Behavior: Behavior normal.    ED Course/Procedures     Procedures  Abnormal Labs Reviewed  CBC WITH DIFFERENTIAL/PLATELET - Abnormal; Notable for the following components:      Result Value   WBC 11.4 (*)    Neutro  Abs 8.4 (*)    All other components within normal limits  COMPREHENSIVE METABOLIC PANEL - Abnormal; Notable for the following components:   Potassium 3.3 (*)    CO2 20 (*)    All other components within normal limits  LIPASE, BLOOD - Abnormal; Notable for the following components:   Lipase 53 (*)    All other components within normal limits    CT Abdomen Pelvis W Contrast  Result Date: 01/09/2021 CLINICAL DATA:  Right lower quadrant abdominal pain.  Vomiting. EXAM: CT ABDOMEN AND PELVIS WITH CONTRAST TECHNIQUE: Multidetector CT imaging of the abdomen and pelvis was performed using the standard protocol following bolus administration of  intravenous contrast. CONTRAST:  OMNIPAQUE IOHEXOL 300 MG/ML  SOLN COMPARISON:  None. FINDINGS: Lower chest: Clear lung bases. Hepatobiliary: No focal liver abnormality is seen. No gallstones, gallbladder wall thickening, or biliary dilatation. Pancreas: Unremarkable. No pancreatic ductal dilatation or surrounding inflammatory changes. Spleen: Normal in size without focal abnormality. Adrenals/Urinary Tract: Adrenal glands are unremarkable. Kidneys are normal, without renal calculi, focal lesion, or hydronephrosis. Bladder is unremarkable. Stomach/Bowel: Normal stomach. Small bowel and colon are normal in caliber. No wall thickening. No inflammation. Appendix not discretely seen. No findings to suggest acute appendicitis. Vascular/Lymphatic: No significant vascular findings are present. No enlarged abdominal or pelvic lymph nodes. Reproductive: Uterus and bilateral adnexa are unremarkable. Other: None. Musculoskeletal: No acute or significant osseous findings. IMPRESSION: 1. Normal enhanced CT scan of the abdomen pelvis. No findings to account for the patient's pain. Normal appendix not discretely seen, but there is no evidence to suggest acute appendicitis. Electronically Signed   By: Amie Portland M.D.   On: 01/09/2021 16:48   US Abdomen Limited  Result Date: 01/09/2021 CLINICAL DATA:  Nausea vomiting after eating EXAM: ULTRASOUND ABDOMEN LIMITED RIGHT UPPER QUADRANT COMPARISON:  None. FINDINGS: Gallbladder: No gallstones or wall thickening visualized. No sonographic Murphy sign noted by sonographer. Common bile duct: Diameter: 3 mm Liver: No focal lesion identified. Within normal limits in parenchymal echogenicity. Portal vein is patent on color Doppler imaging with normal direction of blood flow towards the liver. Other: None. IMPRESSION: Negative right upper quadrant abdominal ultrasound Electronically Signed   By: Jasmine Pang M.D.   On: 01/09/2021 18:51   DG Abd Portable 1V  Result Date:  01/10/2021 CLINICAL DATA:  Severe abdominal pain and vomiting for 2 days EXAM: PORTABLE ABDOMEN - 1 VIEW COMPARISON:  None. FINDINGS: The bowel gas pattern is normal. No radio-opaque calculi or other significant radiographic abnormality are seen. IMPRESSION: Nonobstructive pattern of bowel gas. No free air in the abdomen on supine radiograph. Electronically Signed   By: Lauralyn Primes M.D.   On: 01/10/2021 13:39   US PELVIC COMPLETE W TRANSVAGINAL AND TORSION R/O  Result Date: 01/10/2021 CLINICAL DATA:  Bilateral pelvic pain for 1 month. EXAM: TRANSABDOMINAL AND TRANSVAGINAL ULTRASOUND OF PELVIS DOPPLER ULTRASOUND OF OVARIES TECHNIQUE: Both transabdominal and transvaginal ultrasound examinations of the pelvis were performed. Transabdominal technique was performed for global imaging of the pelvis including uterus, ovaries, adnexal regions, and pelvic cul-de-sac. It was necessary to proceed with endovaginal exam following the transabdominal exam to visualize the LEFT and RIGHT ovary. Color and duplex Doppler ultrasound was utilized to evaluate blood flow to the ovaries. COMPARISON:  CT abdomen and pelvis from January 09, 2021. FINDINGS: Uterus Measurements: 8.0 x 4.2 x 4.0 cm = volume: 70.9 mL. Retroverted uterus without focal abnormality. Endometrium Thickness: 12 mm.  No focal abnormality visualized. Right ovary  Measurements: 3.3 x 1.6 x 2.6 cm = volume: 7.3 mL. Normal appearance/no adnexal mass. Left ovary Measurements: 2.6 x 2.0 x 1.9 cm = volume: 5.1 mL. Normal appearance/no adnexal mass. Pulsed Doppler evaluation of both ovaries demonstrates normal low-resistance arterial and venous waveforms. Other findings Trace free pelvic fluid. IMPRESSION: Unremarkable pelvic sonogram without signs of ovarian torsion. Electronically Signed   By: Donzetta Kohut M.D.   On: 01/10/2021 13:07    MDM    Patient has been seen twice in the last 2 days for abdominal pain and nausea/vomiting. According to the note from the  provider yesterday, patient did not have any CMT on pelvic exam.  Clue cells present on wet prep yesterday, but no Flagyl initiated.  We will initiate this today. I personally reviewed and interpreted her labs and imaging studies from today and yesterday.   CT yesterday without acute abnormality.  Due to the duration of the patient's pain, its intermittent nature, lack of fever, lack of leukocytosis, my suspicion for intra-abdominal abnormality requiring surgery, such as appendicitis, is quite low. Ultrasound today without acute abnormalities. Patient tolerating p.o. prior to discharge. I spent a great deal of time discussing proposed management as well as follow-up with the patient and her parents.   I prescribed an alternative antiemetic, Phenergan, in case the Zofran is ineffective. The patient was given instructions for home care as well as return precautions. Patient voices understanding of these instructions, accepts the plan, and is comfortable with discharge.   Vitals:   01/10/21 1630 01/10/21 1645 01/10/21 1700 01/10/21 1731  BP: 129/76 102/85 119/87   Pulse: 72 64 61   Resp: 15 13 14    Temp:    98.3 F (36.8 C)  TempSrc:    Oral  SpO2: 99% 100% 99%         01/10/21 1940    01/12/21, MD 01/10/21 2126

## 2021-01-10 NOTE — ED Notes (Signed)
Pt given water and saltines for PO challenge 

## 2021-01-11 ENCOUNTER — Other Ambulatory Visit: Payer: Self-pay

## 2021-01-11 ENCOUNTER — Inpatient Hospital Stay (HOSPITAL_COMMUNITY)
Admission: AD | Admit: 2021-01-11 | Discharge: 2021-01-11 | Disposition: A | Discharge status: Home / Self Care | Payer: BC Managed Care – PPO | Attending: Obstetrics & Gynecology | Admitting: Obstetrics & Gynecology

## 2021-01-11 DIAGNOSIS — Z3202 Encounter for pregnancy test, result negative: Secondary | ICD-10-CM | POA: Diagnosis not present

## 2021-01-11 DIAGNOSIS — R109 Unspecified abdominal pain: Secondary | ICD-10-CM | POA: Insufficient documentation

## 2021-01-11 LAB — POCT PREGNANCY, URINE: Preg Test, Ur: NEGATIVE

## 2021-01-11 NOTE — MAU Provider Note (Signed)
Event Date/Time   First Provider Initiated Contact with Patient 01/11/21 1341      S [name] is a 20 y.o. non pregnant patient who presents to MAU today with complaint of abdominal pain. This has been an episodic issue with her cycles since last month. Was evaluated Friday & yesterday at [facility]. Had negative imaging. Was told to follow up with ob/gyn. She is not established with an ob/gyn yet but has an appointment with [facility] ob/[facility]yn next month.  Denies fever/chills or n/v/d.   O BP (!) 103/55 (BP Location: Right Arm)   Pulse (!) 109   Temp 98.8 F (37.1 C) (Oral)   Resp 17   LMP 12/28/2020   SpO2 98%  Physical Exam Vitals and nursing note reviewed.  Constitutional:      General: She is in acute distress (pt hunched over, moaning, and crying).     Appearance: She is well-developed and normal weight. She is not ill-appearing or diaphoretic.  HENT:     Head: Normocephalic and atraumatic.  Eyes:     General: No scleral icterus. Pulmonary:     Effort: Pulmonary effort is normal. No respiratory distress.  Abdominal:     General: Abdomen is flat. There is no distension.     Palpations: Abdomen is soft.  Skin:    General: Skin is warm and dry.  Neurological:     Mental Status: She is alert.  Psychiatric:        Mood and Affect: Mood normal.   Results for orders placed or performed during the hospital encounter of 01/11/21 (from the past 24 hour(s))  Pregnancy, urine POC     Status: None   Collection Time: 01/11/21  1:27 PM  Result Value Ref Range   Preg Test, Ur NEGATIVE NEGATIVE    A Medical screening exam complete 1. Negative pregnancy test   2. Abdominal pain in female      P Discharge from MAU in stable condition Patient given the option of transfer to [facility] for further evaluation or seek care in outpatient facility of choice. Patient would prefer to go home since she has been seen twice in the ED this weekend.  List of options for follow-up  given- given list of local ob/gyns - will call offices tomorrow to see who has the soonest appointment Warning signs for worsening condition that would warrant emergency follow-up discussed   Judeth Horn, NP 01/11/2021 2:25 PM

## 2021-01-11 NOTE — Discharge Instructions (Signed)
  [facility]          [facility]  [address], [place], [place] [address]  [phone]  [facility]  [address], [place], [place] [address]  [phone]  [facility]  [address], [place], [place] [address]  [phone]  [facility] [place]  [address], [place], [place] [address]  [phone]  [facility]  [address]), [place], [place] [address]  [phone]  [facility] [address], [place], [place] [address] [phone]  [facility]  [address], [place], [place] [address]  [phone]  [facility]  [address], [place], [place] [address]  [phone]  [facility]  [address], [place], [place] [address]  [phone]  [facility]  [address], [place], [place] [address]  [phone]  [facility]  [address], [place], [place] [address]  [phone]  [facility]  [address], [place], [place] [address]  [phone]  [facility]   [address], [place], [place] [address]  [phone]  [facility]  [address], [place], [place] [address]   [phone]  [facility]  [address], , [place] [address]  [phone]         

## 2021-01-11 NOTE — MAU Note (Signed)
Pt reports to mau with c/o mid abd pain that has been on going since May 2022.  Pt reports she gets this pain every month with her period.  Reports LMP 12/28/20. States she does not think that shes pregnant.

## 2021-01-12 ENCOUNTER — Telehealth: Payer: Self-pay | Admitting: Emergency Medicine

## 2021-01-12 LAB — GC/CHLAMYDIA PROBE AMP (~~LOC~~) NOT AT ARMC
Chlamydia: NEGATIVE
Comment: NEGATIVE
Comment: NORMAL
Neisseria Gonorrhea: NEGATIVE

## 2021-01-12 NOTE — Telephone Encounter (Signed)
   Patients mother calling, crying requesting sooner appointment for her daughter to be seen.  She states patient is having several issues that have sent her to the ED a few times recently.   Requesting to speak with supervisor  Please call

## 2021-01-28 ENCOUNTER — Other Ambulatory Visit: Payer: Self-pay

## 2021-01-28 ENCOUNTER — Encounter: Payer: Self-pay | Admitting: Emergency Medicine

## 2021-01-28 ENCOUNTER — Ambulatory Visit (INDEPENDENT_AMBULATORY_CARE_PROVIDER_SITE_OTHER): Payer: BC Managed Care – PPO | Admitting: Emergency Medicine

## 2021-01-28 VITALS — BP 110/60 | HR 67 | Temp 98.2°F | Ht 66.0 in | Wt 135.0 lb

## 2021-01-28 DIAGNOSIS — Z1329 Encounter for screening for other suspected endocrine disorder: Secondary | ICD-10-CM | POA: Diagnosis not present

## 2021-01-28 DIAGNOSIS — Z0000 Encounter for general adult medical examination without abnormal findings: Secondary | ICD-10-CM

## 2021-01-28 DIAGNOSIS — Z13220 Encounter for screening for lipoid disorders: Secondary | ICD-10-CM | POA: Diagnosis not present

## 2021-01-28 DIAGNOSIS — M25562 Pain in left knee: Secondary | ICD-10-CM

## 2021-01-28 DIAGNOSIS — Z13228 Encounter for screening for other metabolic disorders: Secondary | ICD-10-CM

## 2021-01-28 DIAGNOSIS — Z130 Encounter for screening for diseases of the blood and blood-forming organs and certain disorders involving the immune mechanism: Secondary | ICD-10-CM

## 2021-01-28 DIAGNOSIS — G8929 Other chronic pain: Secondary | ICD-10-CM

## 2021-01-28 DIAGNOSIS — R21 Rash and other nonspecific skin eruption: Secondary | ICD-10-CM

## 2021-01-28 DIAGNOSIS — M25561 Pain in right knee: Secondary | ICD-10-CM

## 2021-01-28 LAB — COMPREHENSIVE METABOLIC PANEL
ALT: 10 U/L (ref 0–35)
AST: 13 U/L (ref 0–37)
Albumin: 4.1 g/dL (ref 3.5–5.2)
Alkaline Phosphatase: 47 U/L (ref 47–119)
BUN: 7 mg/dL (ref 6–23)
CO2: 25 mEq/L (ref 19–32)
Calcium: 9.1 mg/dL (ref 8.4–10.5)
Chloride: 109 mEq/L (ref 96–112)
Creatinine, Ser: 0.74 mg/dL (ref 0.40–1.20)
GFR: 117.59 mL/min (ref >60.00)
Glucose, Bld: 90 mg/dL (ref 70–99)
Potassium: 3.9 mEq/L (ref 3.5–5.1)
Sodium: 141 mEq/L (ref 135–145)
Total Bilirubin: 0.3 mg/dL (ref 0.2–1.2)
Total Protein: 7.3 g/dL (ref 6.0–8.3)

## 2021-01-28 LAB — CBC WITH DIFFERENTIAL/PLATELET
Basophils Absolute: 0 10*3/uL (ref 0.0–0.1)
Basophils Relative: 0.3 % (ref 0.0–3.0)
Eosinophils Absolute: 0.1 10*3/uL (ref 0.0–0.7)
Eosinophils Relative: 1.1 % (ref 0.0–5.0)
HCT: 33.2 % — ABNORMAL LOW (ref 36.0–49.0)
Hemoglobin: 11 g/dL — ABNORMAL LOW (ref 12.0–16.0)
Lymphocytes Relative: 22.9 % — ABNORMAL LOW (ref 24.0–48.0)
Lymphs Abs: 1.8 10*3/uL (ref 0.7–4.0)
MCHC: 33 g/dL (ref 31.0–37.0)
MCV: 84.7 fl (ref 78.0–98.0)
Monocytes Absolute: 0.5 10*3/uL (ref 0.1–1.0)
Monocytes Relative: 6.5 % (ref 3.0–12.0)
Neutro Abs: 5.3 10*3/uL (ref 1.4–7.7)
Neutrophils Relative %: 69.2 % (ref 43.0–71.0)
Platelets: 177 10*3/uL (ref 150.0–575.0)
RBC: 3.91 Mil/uL (ref 3.80–5.70)
RDW: 16.2 % — ABNORMAL HIGH (ref 11.4–15.5)
WBC: 7.7 10*3/uL (ref 4.5–13.5)

## 2021-01-28 LAB — HEMOGLOBIN A1C: Hgb A1c MFr Bld: 5.5 % (ref 4.6–6.5)

## 2021-01-28 NOTE — Patient Instructions (Addendum)
Health Maintenance, Female Adopting a healthy lifestyle and getting preventive care are important in promoting health and wellness. Ask your health care provider about: The right schedule for you to have regular tests and exams. Things you can do on your own to prevent diseases and keep yourself healthy. What should I know about diet, weight, and exercise? Eat a healthy diet  Eat a diet that includes plenty of vegetables, fruits, low-fat dairy products, and lean protein. Do not eat a lot of foods that are high in solid fats, added sugars, or sodium.  Maintain a healthy weight Body mass index (BMI) is used to identify weight problems. It estimates body fat based on height and weight. Your health care provider can help determineyour BMI and help you achieve or maintain a healthy weight. Get regular exercise Get regular exercise. This is one of the most important things you can do for your health. Most adults should: Exercise for at least 150 minutes each week. The exercise should increase your heart rate and make you sweat (moderate-intensity exercise). Do strengthening exercises at least twice a week. This is in addition to the moderate-intensity exercise. Spend less time sitting. Even light physical activity can be beneficial. Watch cholesterol and blood lipids Have your blood tested for lipids and cholesterol at 20 years of age, then havethis test every 5 years. Have your cholesterol levels checked more often if: Your lipid or cholesterol levels are high. You are older than 19 years of age. You are at high risk for heart disease. What should I know about cancer screening? Depending on your health history and family history, you may need to have cancer screening at various ages. This may include screening for: Breast cancer. Cervical cancer. Colorectal cancer. Skin cancer. Lung cancer. What should I know about heart disease, diabetes, and high blood pressure? Blood pressure and heart  disease High blood pressure causes heart disease and increases the risk of stroke. This is more likely to develop in people who have high blood pressure readings, are of African descent, or are overweight. Have your blood pressure checked: Every 3-5 years if you are 18-39 years of age. Every year if you are 40 years old or older. Diabetes Have regular diabetes screenings. This checks your fasting blood sugar level. Have the screening done: Once every three years after age 40 if you are at a normal weight and have a low risk for diabetes. More often and at a younger age if you are overweight or have a high risk for diabetes. What should I know about preventing infection? Hepatitis B If you have a higher risk for hepatitis B, you should be screened for this virus. Talk with your health care provider to find out if you are at risk forhepatitis B infection. Hepatitis C Testing is recommended for: Everyone born from 1945 through 1965. Anyone with known risk factors for hepatitis C. Sexually transmitted infections (STIs) Get screened for STIs, including gonorrhea and chlamydia, if: You are sexually active and are younger than 19 years of age. You are older than 19 years of age and your health care provider tells you that you are at risk for this type of infection. Your sexual activity has changed since you were last screened, and you are at increased risk for chlamydia or gonorrhea. Ask your health care provider if you are at risk. Ask your health care provider about whether you are at high risk for HIV. Your health care provider may recommend a prescription medicine to help   prevent HIV infection. If you choose to take medicine to prevent HIV, you should first get tested for HIV. You should then be tested every 3 months for as long as you are taking the medicine. Pregnancy If you are about to stop having your period (premenopausal) and you may become pregnant, seek counseling before you get  pregnant. Take 400 to 800 micrograms (mcg) of folic acid every day if you become pregnant. Ask for birth control (contraception) if you want to prevent pregnancy. Osteoporosis and menopause Osteoporosis is a disease in which the bones lose minerals and strength with aging. This can result in bone fractures. If you are 42 years old or older, or if you are at risk for osteoporosis and fractures, ask your health care provider if you should: Be screened for bone loss. Take a calcium or vitamin D supplement to lower your risk of fractures. Be given hormone replacement therapy (HRT) to treat symptoms of menopause. Follow these instructions at home: Lifestyle Do not use any products that contain nicotine or tobacco, such as cigarettes, e-cigarettes, and chewing tobacco. If you need help quitting, ask your health care provider. Do not use street drugs. Do not share needles. Ask your health care provider for help if you need support or information about quitting drugs. Alcohol use Do not drink alcohol if: Your health care provider tells you not to drink. You are pregnant, may be pregnant, or are planning to become pregnant. If you drink alcohol: Limit how much you use to 0-1 drink a day. Limit intake if you are breastfeeding. Be aware of how much alcohol is in your drink. In the [place] one drink equals one 12 oz bottle of beer (355 mL), one 5 oz glass of wine (148 mL), or one 1 oz glass of hard liquor (44 mL). General instructions Schedule regular health, dental, and eye exams. Stay current with your vaccines. Tell your health care provider if: You often feel depressed. You have ever been abused or do not feel safe at home. Summary Adopting a healthy lifestyle and getting preventive care are important in promoting health and wellness. Follow your health care provider's instructions about healthy diet, exercising, and getting tested or screened for diseases. Follow your health care provider's  instructions on monitoring your cholesterol and blood pressure. This information is not intended to replace advice given to you by your health care provider. Make sure you discuss any questions you have with your healthcare provider. Document Revised: 06/28/2018 Document Reviewed: 06/28/2018 Elsevier Patient Education  2022 Else[employer] Inc[employer] HPV and Cancer Information HPV (human papillomavirus)is a very common virus that spreads easily from person to person through skin-to-skin or sexual contact. There are many types of HPV. It often does not cause symptoms. However, depending upon the type, it may sometimes cause warts in the genitals (genital or mucosal HPV), or on the hands or feet (cutaneous or nonmucosal HPV). It is possible to be infected for a long time and pass HPV to others withoutknowing it. Some HPV infections go away on their own within 2 years, but other HPV infections are considered high-risk and may cause changes in cells that could lead to cancer. You can take steps to avoid HPV infection and to lower yourrisk of getting cancer. How can HPV affect me? HPV can cause warts in the genitals or on the hands or feet. It can also causewart-like lesions in the throat.  Certain types of genital HPV can also cause cancer, which may include: Cervical cancer. Vaginal  cancer. Vulvar cancer. Anal cancer. Throat cancer. Tongue or mouth cancer. Penile cancer. How does HPV spread? HPV spreads easily through direct person to person contact. Genital HPV spreads through sexual contact. You can get HPV from vaginal sex, oral sex, anal sex, or just by touching someone's genitals. Even people who have only one sexual partner may have HPV because that partner may have it. HPV often does not causesymptoms, so most infected people do not know that they have it. What actions can I take to prevent HPV? Take the following steps to help prevent HPV infection: Talk with your health care provider about getting the  HPV vaccine. This vaccine protects against the types of HPV that could cause cancer. Limit the number of people you have sex with. Also, avoid having sex with people who have had many sexual partners. Use a condom during sex. Talk with your sexual partners about their health. What actions can I take to lower my risk for cancer? Having a healthy lifestyle and taking some preventive steps can help lower your cancer risk, whether or not you have genital HPV. Some steps you can takeinclude: Lifestyle Practice safe sex to help prevent HPV infection. Do not use any products that contain nicotine or tobacco, such as cigarettes, e-cigarettes, and chewing tobacco. If you need help quitting, ask your health care provider. Eat foods that have antioxidants, such as fruits, vegetables, and grains. Try to eat at least 5 servings of fruits and vegetables every day. Get regular exercise. Lose weight if you are overweight. Practice good oral hygiene. This includes flossing and brushing your teeth every day. Other preventive steps Get the HPV vaccine as told by your health care provider. Get tested for STIs even if you do not have symptoms of HPV. You may have HPV and not know it. If you are a woman, get regular Pap and HPV tests. Talk with your health care provider about how often you need these tests. Pap tests will help identify changes in cells that can lead to cancer. HPV tests will help identify the presence of HPV in cells in the cervix. Where to find more information Learn more about HPV and cancer from: Centers for Disease Control and Prevention: [employer] National Cancer Institute: www.cancer.gov American Cancer Society: www.cancer.org Contact a health care provider if: You have genital warts. You are sexually active and think you may have HPV. You did not protect yourself during sex and would like to be tested for STIs. Summary Human papillomavirus (HPV) is a very common virus that spreads  easily from person to person and ishighly contagious. Certain types of genital HPV are considered to be high risk and may cause changes in cells that could lead to cancer. You should take steps to avoid HPV infection, such as limiting the number of people you have sex with, using condoms during sex, and getting the HPV vaccine. Lifestyle changes can help lower your risk of cancer. These include eating a healthy diet, getting regular exercise, and not using any products that contain nicotine or tobacco. You may have HPV and not know it. Get tested for STIs even if you do not have symptoms of HPV. If you are a woman, have regular Pap tests and HPV tests as directed by your health care provider. This information is not intended to replace advice given to you by your health care provider. Make sure you discuss any questions you have with your healthcare provider. Document Revised: 02/19/2020 Document Reviewed: 02/19/2020 Elsevier Patient Education  2022 [employer] Inc.  

## 2021-01-28 NOTE — Progress Notes (Signed)
Sarah Cabrera 20 y.o.   Chief Complaint  Patient presents with   Annual Exam    Pt not sure if she wants HPV vaccine, would like more info    HISTORY OF PRESENT ILLNESS: This is a 20 y.o. female here for annual exam. Healthy female with a healthy lifestyle. Had recent bacterial vaginosis infection. Recently started on birth control pills.  Recent GYN examination. 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  Non-smoker. Wants information on HPV. Complaining of bilateral intermittent knee pain. Also complaining of rash to side of her face, back. No other complaints or medical concerns today.  HPI   Prior to Admission medications   Medication Sig Start Date End Date Taking? Authorizing Provider  metroNIDAZOLE (FLAGYL) 500 MG tablet Take 1 tablet (500 mg total) by mouth 2 (two) times daily. 01/10/21  Yes Joy, Shawn C, PA-C  naproxen (NAPROSYN) 500 MG tablet Take 1 tablet (500 mg total) by mouth 2 (two) times daily. 01/09/21  Yes Theron AristaSage, Haley, PA-C  ondansetron (ZOFRAN ODT) 4 MG disintegrating tablet Take 1 tablet (4 mg total) by mouth every 8 (eight) hours as needed for nausea or vomiting. 01/10/21  Yes Joy, Shawn C, PA-C  ondansetron (ZOFRAN) 4 MG tablet Take 1 tablet (4 mg total) by mouth every 6 (six) hours. 01/09/21  Yes Theron AristaSage, Haley, PA-C  potassium chloride SA (KLOR-CON) 20 MEQ tablet Take 1 tablet (20 mEq total) by mouth 2 (two) times daily. 01/09/21  Yes Theron AristaSage, Haley, PA-C  promethazine (PHENERGAN) 25 MG suppository Place 1 suppository (25 mg total) rectally every 6 (six) hours as needed for nausea or vomiting. 01/10/21  Yes Joy, Shawn C, PA-C    No Known Allergies  There are no problems to display for this patient.   History reviewed. No pertinent past medical history.  Past Surgical History:  Procedure Laterality Date   APPENDECTOMY      Social History   Socioeconomic History   Marital status: Single    Spouse name: Not on file   Number of children: Not on file   Years of education: Not on file    Highest education level: Not on file  Occupational History   Not on file  Tobacco Use   Smoking status: Never   Smokeless tobacco: Never  Vaping Use   Vaping Use: Never used  Substance and Sexual Activity   Alcohol use: Never   Drug use: Yes    Types: Marijuana   Sexual activity: Not on file  Other Topics Concern   Not on file  Social History Narrative   Not on file   Social Determinants of Health   Financial Resource Strain: Not on file  Food Insecurity: Not on file  Transportation Needs: Not on file  Physical Activity: Not on file  Stress: Not on file  Social Connections: Not on file  Intimate Partner Violence: Not on file    Family History  Problem Relation Age of Onset   Healthy Mother    Cancer Father      Review of Systems  Constitutional: Negative.  Negative for chills and fever.  HENT: Negative.  Negative for congestion and sore throat.   Respiratory: Negative.  Negative for cough and shortness of breath.   Cardiovascular: Negative.  Negative for chest pain and palpitations.  Gastrointestinal:  Negative for abdominal pain, diarrhea, nausea and vomiting.  Genitourinary: Negative.  Negative for dysuria and hematuria.  Musculoskeletal:  Positive for joint pain (Bilateral knee pains).  Skin:  Positive for rash.  Neurological:  Negative for dizziness and headaches.  All other systems reviewed and are negative.   Physical Exam Vitals reviewed.  Constitutional:      Appearance: Normal appearance.  HENT:     Head: Normocephalic.     Right Ear: Tympanic membrane, ear canal and external ear normal.     Left Ear: Tympanic membrane, ear canal and external ear normal.  Eyes:     Extraocular Movements: Extraocular movements intact.     Conjunctiva/sclera: Conjunctivae normal.     Pupils: Pupils are equal, round, and reactive to light.  Cardiovascular:     Rate and Rhythm: Normal rate and regular rhythm.     Pulses: Normal pulses.     Heart sounds: Normal  heart sounds.  Pulmonary:     Effort: Pulmonary effort is normal.     Breath sounds: Normal breath sounds.  Abdominal:     General: Bowel sounds are normal. There is no distension.     Palpations: Abdomen is soft.     Tenderness: There is no abdominal tenderness.  Musculoskeletal:        General: Normal range of motion.     Cervical back: Normal range of motion and neck supple. No tenderness.     Right lower leg: No edema.     Left lower leg: No edema.  Lymphadenopathy:     Cervical: No cervical adenopathy.  Skin:    General: Skin is warm and dry.     Capillary Refill: Capillary refill takes less than 2 seconds.     Findings: Rash (Hypopigmented round lesions to right side of face and back) present.  Neurological:     General: No focal deficit present.     Mental Status: She is alert and oriented to person, place, and time.  Psychiatric:        Mood and Affect: Mood normal.        Behavior: Behavior normal.     ASSESSMENT & PLAN: Sarah Cabrera was seen today for annual exam.  Diagnoses and all orders for this visit:  Routine general medical examination at a health care facility  Screening for deficiency anemia -     CBC with Differential  Screening for lipoid disorders  Screening for endocrine, metabolic and immunity disorder -     Comprehensive metabolic panel -     Hemoglobin A1c  Chronic pain of both knees -     Ambulatory referral to Orthopedic Surgery  Skin rash Comments: Suspected tinea versicolor Orders: -     Ambulatory referral to Dermatology  Modifiable risk factors discussed with patient. Anticipatory guidance according to age provided. The following topics were also discussed: Social Determinants of Health Smoking Diet and nutrition and benefits of reducing daily carbohydrate intake Benefits of exercise Vaccinations recommendations in particular HPV Practice safe sex Cardiovascular risk assessment Mental health including depression and anxiety and  mind-body connection Fall and accident prevention  Patient Instructions  Health Maintenance, Female Adopting a healthy lifestyle and getting preventive care are important in promoting health and wellness. Ask your health care provider about: The right schedule for you to have regular tests and exams. Things you can do on your own to prevent diseases and keep yourself healthy. What should I know about diet, weight, and exercise? Eat a healthy diet  Eat a diet that includes plenty of vegetables, fruits, low-fat dairy products, and lean protein. Do not eat a lot of foods that are high in solid fats, added sugars, or sodium.  Maintain a healthy  weight Body mass index (BMI) is used to identify weight problems. It estimates body fat based on height and weight. Your health care provider can help determineyour BMI and help you achieve or maintain a healthy weight. Get regular exercise Get regular exercise. This is one of the most important things you can do for your health. Most adults should: Exercise for at least 150 minutes each week. The exercise should increase your heart rate and make you sweat (moderate-intensity exercise). Do strengthening exercises at least twice a week. This is in addition to the moderate-intensity exercise. Spend less time sitting. Even light physical activity can be beneficial. Watch cholesterol and blood lipids Have your blood tested for lipids and cholesterol at 19 years of age, then havethis test every 5 years. Have your cholesterol levels checked more often if: Your lipid or cholesterol levels are high. You are older than 19 years of age. You are at high risk for heart disease. What should I know about cancer screening? Depending on your health history and family history, you may need to have cancer screening at various ages. This may include screening for: Breast cancer. Cervical cancer. Colorectal cancer. Skin cancer. Lung cancer. What should I know about  heart disease, diabetes, and high blood pressure? Blood pressure and heart disease High blood pressure causes heart disease and increases the risk of stroke. This is more likely to develop in people who have high blood pressure readings, are of African descent, or are overweight. Have your blood pressure checked: Every 3-5 years if you are 72-51 years of age. Every year if you are 20 years old or older. Diabetes Have regular diabetes screenings. This checks your fasting blood sugar level. Have the screening done: Once every three years after age 27 if you are at a normal weight and have a low risk for diabetes. More often and at a younger age if you are overweight or have a high risk for diabetes. What should I know about preventing infection? Hepatitis B If you have a higher risk for hepatitis B, you should be screened for this virus. Talk with your health care provider to find out if you are at risk forhepatitis B infection. Hepatitis C Testing is recommended for: Everyone born from [birth year] through 1964. Anyone with known risk factors for hepatitis C. Sexually transmitted infections (STIs) Get screened for STIs, including gonorrhea and chlamydia, if: You are sexually active and are younger than 19 years of age. You are older than 19 years of age and your health care provider tells you that you are at risk for this type of infection. Your sexual activity has changed since you were last screened, and you are at increased risk for chlamydia or gonorrhea. Ask your health care provider if you are at risk. Ask your health care provider about whether you are at high risk for HIV. Your health care provider may recommend a prescription medicine to help prevent HIV infection. If you choose to take medicine to prevent HIV, you should first get tested for HIV. You should then be tested every 3 months for as long as you are taking the medicine. Pregnancy If you are about to stop having your period  (premenopausal) and you may become pregnant, seek counseling before you get pregnant. Take 400 to 800 micrograms (mcg) of folic acid every day if you become pregnant. Ask for birth control (contraception) if you want to prevent pregnancy. Osteoporosis and menopause Osteoporosis is a disease in which the bones lose minerals and  strength with aging. This can result in bone fractures. If you are 30 years old or older, or if you are at risk for osteoporosis and fractures, ask your health care provider if you should: Be screened for bone loss. Take a calcium or vitamin D supplement to lower your risk of fractures. Be given hormone replacement therapy (HRT) to treat symptoms of menopause. Follow these instructions at home: Lifestyle Do not use any products that contain nicotine or tobacco, such as cigarettes, e-cigarettes, and chewing tobacco. If you need help quitting, ask your health care provider. Do not use street drugs. Do not share needles. Ask your health care provider for help if you need support or information about quitting drugs. Alcohol use Do not drink alcohol if: Your health care provider tells you not to drink. You are pregnant, may be pregnant, or are planning to become pregnant. If you drink alcohol: Limit how much you use to 0-1 drink a day. Limit intake if you are breastfeeding. Be aware of how much alcohol is in your drink. In the [place] one drink equals one 12 oz bottle of beer (355 mL), one 5 oz glass of wine (148 mL), or one 1 oz glass of hard liquor (44 mL). General instructions Schedule regular health, dental, and eye exams. Stay current with your vaccines. Tell your health care provider if: You often feel depressed. You have ever been abused or do not feel safe at home. Summary Adopting a healthy lifestyle and getting preventive care are important in promoting health and wellness. Follow your health care provider's instructions about healthy diet, exercising, and  getting tested or screened for diseases. Follow your health care provider's instructions on monitoring your cholesterol and blood pressure. This information is not intended to replace advice given to you by your health care provider. Make sure you discuss any questions you have with your healthcare provider. Document Revised: 06/28/2018 Document Reviewed: 06/28/2018 Elsevier Patient Education  2022 Elsevier Inc. HPV and Cancer Information HPV (human papillomavirus)is a very common virus that spreads easily from person to person through skin-to-skin or sexual contact. There are many types of HPV. It often does not cause symptoms. However, depending upon the type, it may sometimes cause warts in the genitals (genital or mucosal HPV), or on the hands or feet (cutaneous or nonmucosal HPV). It is possible to be infected for a long time and pass HPV to others withoutknowing it. Some HPV infections go away on their own within 2 years, but other HPV infections are considered high-risk and may cause changes in cells that could lead to cancer. You can take steps to avoid HPV infection and to lower yourrisk of getting cancer. How can HPV affect me? HPV can cause warts in the genitals or on the hands or feet. It can also causewart-like lesions in the throat.  Certain types of genital HPV can also cause cancer, which may include: Cervical cancer. Vaginal cancer. Vulvar cancer. Anal cancer. Throat cancer. Tongue or mouth cancer. Penile cancer. How does HPV spread? HPV spreads easily through direct person to person contact. Genital HPV spreads through sexual contact. You can get HPV from vaginal sex, oral sex, anal sex, or just by touching someone's genitals. Even people who have only one sexual partner may have HPV because that partner may have it. HPV often does not causesymptoms, so most infected people do not know that they have it. What actions can I take to prevent HPV? Take the following steps to help  prevent HPV  infection: Talk with your health care provider about getting the HPV vaccine. This vaccine protects against the types of HPV that could cause cancer. Limit the number of people you have sex with. Also, avoid having sex with people who have had many sexual partners. Use a condom during sex. Talk with your sexual partners about their health. What actions can I take to lower my risk for cancer? Having a healthy lifestyle and taking some preventive steps can help lower your cancer risk, whether or not you have genital HPV. Some steps you can takeinclude: Lifestyle Practice safe sex to help prevent HPV infection. Do not use any products that contain nicotine or tobacco, such as cigarettes, e-cigarettes, and chewing tobacco. If you need help quitting, ask your health care provider. Eat foods that have antioxidants, such as fruits, vegetables, and grains. Try to eat at least 5 servings of fruits and vegetables every day. Get regular exercise. Lose weight if you are overweight. Practice good oral hygiene. This includes flossing and brushing your teeth every day. Other preventive steps Get the HPV vaccine as told by your health care provider. Get tested for STIs even if you do not have symptoms of HPV. You may have HPV and not know it. If you are a woman, get regular Pap and HPV tests. Talk with your health care provider about how often you need these tests. Pap tests will help identify changes in cells that can lead to cancer. HPV tests will help identify the presence of HPV in cells in the cervix. Where to find more information Learn more about HPV and cancer from: Centers for Disease Control and Prevention: RunningConvention.de National Cancer Institute: www.cancer.gov American Cancer Society: www.cancer.org Contact a health care provider if: You have genital warts. You are sexually active and think you may have HPV. You did not protect yourself during sex and would like to be tested for  STIs. Summary Human papillomavirus (HPV) is a very common virus that spreads easily from person to person and ishighly contagious. Certain types of genital HPV are considered to be high risk and may cause changes in cells that could lead to cancer. You should take steps to avoid HPV infection, such as limiting the number of people you have sex with, using condoms during sex, and getting the HPV vaccine. Lifestyle changes can help lower your risk of cancer. These include eating a healthy diet, getting regular exercise, and not using any products that contain nicotine or tobacco. You may have HPV and not know it. Get tested for STIs even if you do not have symptoms of HPV. If you are a woman, have regular Pap tests and HPV tests as directed by your health care provider. This information is not intended to replace advice given to you by your health care provider. Make sure you discuss any questions you have with your healthcare provider. Document Revised: 02/19/2020 Document Reviewed: 02/19/2020 Elsevier Patient Education  2022 Elsevier Inc.   Edwina Barth, MD [facility]

## 2021-02-10 ENCOUNTER — Ambulatory Visit (INDEPENDENT_AMBULATORY_CARE_PROVIDER_SITE_OTHER): Payer: BLUE CROSS/BLUE SHIELD | Admitting: Orthopaedic Surgery

## 2021-02-10 ENCOUNTER — Encounter: Payer: Self-pay | Admitting: Orthopaedic Surgery

## 2021-02-10 ENCOUNTER — Other Ambulatory Visit: Payer: Self-pay

## 2021-02-10 ENCOUNTER — Ambulatory Visit: Payer: Self-pay

## 2021-02-10 VITALS — Ht 65.0 in | Wt 138.0 lb

## 2021-02-10 DIAGNOSIS — M222X1 Patellofemoral disorders, right knee: Secondary | ICD-10-CM | POA: Diagnosis not present

## 2021-02-10 DIAGNOSIS — M222X2 Patellofemoral disorders, left knee: Secondary | ICD-10-CM

## 2021-02-10 NOTE — Progress Notes (Signed)
   Office Visit Note   Patient: Sarah Cabrera           Date of Birth: 14-Jul-2001           MRN: [id number] Visit Date: 02/10/2021              Requested by: Georgina Quint, MD [address] [place],  [place] [address] PCP: Georgina Quint, MD   Assessment & Plan: Visit Diagnoses:  1. Patellofemoral pain syndrome of both knees     Plan: Impression is bilateral knee patellofemoral syndrome.  Today, we discussed the need to strengthen her quads to help with pain control.  We have also discussed providing her with a PSO braces to both knees.  She would like to proceed with both.  Internal referral for therapy has been made.  Follow-up with [place] as needed.  Follow-Up Instructions: No follow-ups on file.   Orders:  Orders Placed This Encounter  Procedures   XR KNEE 3 VIEW LEFT   XR KNEE 3 VIEW RIGHT   No orders of the defined types were placed in this encounter.     Procedures: No procedures performed   Clinical Data: No additional findings.   Subjective: Chief Complaint  Patient presents with   Right Knee - Pain   Left Knee - Pain    HPI patient is a pleasant 20 year old female who comes in today with chronic bilateral knee pain both equally as bad.  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  No known injury.  The pain is to the lateral aspect of both knees.  She describes instability as well as the fact that she easily fatigues.  She notes occasional locking and catching.  No particular aggravators.  She has not been taking any medication for pain.   Review of Systems as detailed in HPI.  All others reviewed and are negative.   Objective: Vital Signs: Ht 5\' 5"  (1.651 m)   Wt 138 lb (62.6 kg)   BMI 22.96 kg/m   Physical Exam well-developed well-nourished female no acute distress.  Alert and oriented x3.  Ortho Exam bilateral knee exam shows no effusion.  Range of motion 0 to 120  degrees.  Mild lateral joint line tenderness.  Ligaments are stable.  No patellar apprehension.  4 out of 5 strength with resisted straight leg raise.  She is neurovascular intact distally.  Specialty Comments:  No specialty comments available.  Imaging: No results found.   PMFS History: There are no problems to display for this patient.  History reviewed. No pertinent past medical history.  Family History  Problem Relation Age of Onset   Healthy Mother    Cancer Father     Past Surgical History:  Procedure Laterality Date   APPENDECTOMY     Social History   Occupational History   Not on file  Tobacco Use   Smoking status: Never   Smokeless tobacco: Never  Vaping Use   Vaping Use: Never used  Substance and Sexual Activity   Alcohol use: Never   Drug use: Yes    Types: Marijuana   Sexual activity: Not on file

## 2021-08-20 ENCOUNTER — Ambulatory Visit: Payer: BC Managed Care – PPO | Admitting: Physician Assistant

## 2021-09-09 ENCOUNTER — Emergency Department (HOSPITAL_COMMUNITY): Payer: BLUE CROSS/BLUE SHIELD

## 2021-09-09 ENCOUNTER — Emergency Department (HOSPITAL_COMMUNITY)
Admission: EM | Admit: 2021-09-09 | Discharge: 2021-09-09 | Disposition: A | Discharge status: Home / Self Care | Payer: BLUE CROSS/BLUE SHIELD | Attending: Emergency Medicine | Admitting: Emergency Medicine

## 2021-09-09 DIAGNOSIS — R112 Nausea with vomiting, unspecified: Secondary | ICD-10-CM | POA: Insufficient documentation

## 2021-09-09 DIAGNOSIS — R1084 Generalized abdominal pain: Secondary | ICD-10-CM | POA: Insufficient documentation

## 2021-09-09 DIAGNOSIS — R109 Unspecified abdominal pain: Secondary | ICD-10-CM

## 2021-09-09 LAB — CBC WITH DIFFERENTIAL/PLATELET
Abs Immature Granulocytes: 0.05 10*3/uL (ref 0.00–0.07)
Basophils Absolute: 0 10*3/uL (ref 0.0–0.1)
Basophils Relative: 0 %
Eosinophils Absolute: 0 10*3/uL (ref 0.0–0.5)
Eosinophils Relative: 0 %
HCT: 37.5 % (ref 36.0–46.0)
Hemoglobin: 12 g/dL (ref 12.0–15.0)
Immature Granulocytes: 0 %
Lymphocytes Relative: 14 %
Lymphs Abs: 1.7 10*3/uL (ref 0.7–4.0)
MCH: 27.6 pg (ref 26.0–34.0)
MCHC: 32 g/dL (ref 30.0–36.0)
MCV: 86.2 fL (ref 80.0–100.0)
Monocytes Absolute: 0.5 10*3/uL (ref 0.1–1.0)
Monocytes Relative: 4 %
Neutro Abs: 10 10*3/uL — ABNORMAL HIGH (ref 1.7–7.7)
Neutrophils Relative %: 82 %
Platelets: 210 10*3/uL (ref 150–400)
RBC: 4.35 MIL/uL (ref 3.87–5.11)
RDW: 14.7 % (ref 11.5–15.5)
WBC: 12.3 10*3/uL — ABNORMAL HIGH (ref 4.0–10.5)
nRBC: 0 % (ref 0.0–0.2)

## 2021-09-09 LAB — COMPREHENSIVE METABOLIC PANEL
ALT: 15 U/L (ref 0–44)
AST: 21 U/L (ref 15–41)
Albumin: 3.8 g/dL (ref 3.5–5.0)
Alkaline Phosphatase: 42 U/L (ref 38–126)
Anion gap: 12 (ref 5–15)
BUN: 11 mg/dL (ref 6–20)
CO2: 19 mmol/L — ABNORMAL LOW (ref 22–32)
Calcium: 9.1 mg/dL (ref 8.9–10.3)
Chloride: 108 mmol/L (ref 98–111)
Creatinine, Ser: 0.97 mg/dL (ref 0.44–1.00)
GFR, Estimated: >60 mL/min (ref >60)
Glucose, Bld: 119 mg/dL — ABNORMAL HIGH (ref 70–99)
Potassium: 3.3 mmol/L — ABNORMAL LOW (ref 3.5–5.1)
Sodium: 139 mmol/L (ref 135–145)
Total Bilirubin: 0.4 mg/dL (ref 0.3–1.2)
Total Protein: 7.4 g/dL (ref 6.5–8.1)

## 2021-09-09 LAB — URINALYSIS, ROUTINE W REFLEX MICROSCOPIC
Bilirubin Urine: NEGATIVE
Glucose, UA: NEGATIVE mg/dL
Hgb urine dipstick: NEGATIVE
Ketones, ur: 20 mg/dL — AB
Leukocytes,Ua: NEGATIVE
Nitrite: NEGATIVE
Protein, ur: NEGATIVE mg/dL
Specific Gravity, Urine: 1.028 (ref 1.005–1.030)
pH: 5 (ref 5.0–8.0)

## 2021-09-09 LAB — PREGNANCY, URINE: Preg Test, Ur: NEGATIVE

## 2021-09-09 MED ORDER — KETOROLAC TROMETHAMINE 30 MG/ML IJ SOLN
30.0000 mg | Freq: Once | INTRAMUSCULAR | Status: AC
  Administered 2021-09-09: 30 mg via INTRAVENOUS
  Filled 2021-09-09: qty 1

## 2021-09-09 MED ORDER — IBUPROFEN 600 MG PO TABS: 600.0000 mg | ORAL_TABLET | Freq: Four times a day (QID) | ORAL | 0 refills | Status: DC | PRN

## 2021-09-09 MED ORDER — SODIUM CHLORIDE 0.9 % IV BOLUS
1000.0000 mL | Freq: Once | INTRAVENOUS | Status: AC
  Administered 2021-09-09: 1000 mL via INTRAVENOUS

## 2021-09-09 MED ORDER — MORPHINE SULFATE (PF) 2 MG/ML IV SOLN
2.0000 mg | Freq: Once | INTRAVENOUS | Status: DC
  Filled 2021-09-09: qty 1

## 2021-09-09 MED ORDER — ONDANSETRON 4 MG PO TBDP
4.0000 mg | ORAL_TABLET | Freq: Once | ORAL | Status: AC
  Administered 2021-09-09: 4 mg via ORAL
  Filled 2021-09-09: qty 1

## 2021-09-09 MED ORDER — MORPHINE SULFATE (PF) 4 MG/ML IV SOLN
4.0000 mg | Freq: Once | INTRAVENOUS | Status: AC
  Administered 2021-09-09: 4 mg via INTRAVENOUS
  Filled 2021-09-09: qty 1

## 2021-09-09 MED ORDER — ONDANSETRON HCL 4 MG/2ML IJ SOLN
4.0000 mg | Freq: Once | INTRAMUSCULAR | Status: DC
  Filled 2021-09-09: qty 2

## 2021-09-09 MED ORDER — ONDANSETRON 4 MG PO TBDP: 4.0000 mg | ORAL_TABLET | Freq: Three times a day (TID) | ORAL | 0 refills | Status: DC | PRN

## 2021-09-09 MED ORDER — IOHEXOL 350 MG/ML SOLN
80.0000 mL | Freq: Once | INTRAVENOUS | Status: AC | PRN
  Administered 2021-09-09: 80 mL via INTRAVENOUS

## 2021-09-09 MED ORDER — ONDANSETRON HCL 4 MG/2ML IJ SOLN
4.0000 mg | Freq: Once | INTRAMUSCULAR | Status: AC
  Administered 2021-09-09: 4 mg via INTRAVENOUS
  Filled 2021-09-09: qty 2

## 2021-09-09 MED ORDER — HYDROCODONE-ACETAMINOPHEN 5-325 MG PO TABS: 1.0000 | ORAL_TABLET | ORAL | 0 refills | Status: DC | PRN

## 2021-09-09 NOTE — ED Notes (Signed)
Pt c/o "being hot."  Fan provided.

## 2021-09-09 NOTE — ED Notes (Signed)
Following medication administration, Pt began c/o sharp central abdominal pain.  EDP made aware.

## 2021-09-09 NOTE — ED Provider Triage Note (Signed)
Emergency Medicine Provider Triage Evaluation Note  Sarah Cabrera , a 21 y.o. female  was evaluated in triage.  Pt complains of nausea and vomiting.  Pt reports vomiting and cramping when she has her period.  Pt has had on and off for 1 year   Review of Systems  Positive: vomiting Negative: fever  Physical Exam  BP 125/60 (BP Location: Left Arm)    Pulse 83    Temp 97.6 F (36.4 C)    Resp (!) 22    LMP 08/04/2021    SpO2 93%  Gen:   Awake, no distress   Resp:  Normal effort  MSK:   Moves extremities without difficulty  Other:    Medical Decision Making  Medically screening exam initiated at 10:22 AM.  Appropriate orders placed.  Sarah Cabrera was informed that the remainder of the evaluation will be completed by another provider, this initial triage assessment does not replace that evaluation, and the importance of remaining in the ED until their evaluation is complete.  Pt given odt zofran.  Labs ordered    Sarah Cabrera, [place] 09/09/21 1024

## 2021-09-09 NOTE — ED Notes (Signed)
Pt and mother initally refused morphine and Pt changed her minds after speaking to Consulting civil engineer and EDP.

## 2021-09-09 NOTE — ED Triage Notes (Addendum)
Pt. Stated, I woke up this morning and Im nauseated and throw up, this started last May. Its always when Im on my monthly

## 2021-09-09 NOTE — ED Provider Notes (Signed)
[facility] EMERGENCY DEPARTMENT Provider Note   CSN: [id number] Arrival date & time: 09/09/21  1001     History  No chief complaint on file.   Sarah Cabrera is a 21 y.o. female.  Pt is a 21 yo aa female with no significant past medical hx.   Pt has had intermittent abd pain for a year.  Pt has had some n/v.  Pt said it usually occurs when she has her period.  Pt said she went to her obgyn and they sent her here.  Pt is initially very calm.  However, when her mom arrives in the room, she starts screaming and yelling.  Mom is yelling at me at the nurses telling us how awful we are because the pt is in pain.        Home Medications Prior to Admission medications   Medication Sig Start Date End Date Taking? Authorizing Provider  HYDROcodone-acetaminophen (NORCO/VICODIN) 5-325 MG tablet Take 1 tablet by mouth every 4 (four) hours as needed. 09/09/21  Yes Jacalyn Lefevre, MD  ibuprofen (ADVIL) 600 MG tablet Take 1 tablet (600 mg total) by mouth every 6 (six) hours as needed. 09/09/21  Yes Jacalyn Lefevre, MD  ondansetron (ZOFRAN-ODT) 4 MG disintegrating tablet Take 1 tablet (4 mg total) by mouth every 8 (eight) hours as needed for nausea or vomiting. 09/09/21  Yes Jacalyn Lefevre, MD  Levonorgestrel-Ethinyl Estradiol (AMETHIA) 0.15-0.03 &0.01 MG tablet Take 1 tablet by mouth daily.    [provider]  metroNIDAZOLE (FLAGYL) 500 MG tablet Take 1 tablet (500 mg total) by mouth 2 (two) times daily. 01/10/21   Joy, Shawn C, PA-C  naproxen (NAPROSYN) 500 MG tablet Take 1 tablet (500 mg total) by mouth 2 (two) times daily. 01/09/21   Theron Arista, PA-C  ondansetron (ZOFRAN) 4 MG tablet Take 1 tablet (4 mg total) by mouth every 6 (six) hours. 01/09/21   Theron Arista, PA-C  potassium chloride SA (KLOR-CON) 20 MEQ tablet Take 1 tablet (20 mEq total) by mouth 2 (two) times daily. 01/09/21   Theron Arista, PA-C  promethazine (PHENERGAN) 25 MG suppository Place 1 suppository (25 mg  total) rectally every 6 (six) hours as needed for nausea or vomiting. 01/10/21   Joy, Shawn C, PA-C      Allergies    Patient has no known allergies.    Review of Systems   Review of Systems  Gastrointestinal:  Positive for abdominal pain.  All other systems reviewed and are negative.  Physical Exam Updated Vital Signs BP (!) 90/57    Pulse (!) 55    Temp 97.6 F (36.4 C)    Resp 16    LMP 08/04/2021    SpO2 98%  Physical Exam Vitals and nursing note reviewed.  Constitutional:      Appearance: Normal appearance.  HENT:     Head: Normocephalic and atraumatic.     Right Ear: External ear normal.     Left Ear: External ear normal.     Nose: Nose normal.     Mouth/Throat:     Mouth: Mucous membranes are moist.     Pharynx: Oropharynx is clear.  Eyes:     Extraocular Movements: Extraocular movements intact.     Conjunctiva/sclera: Conjunctivae normal.     Pupils: Pupils are equal, round, and reactive to light.  Cardiovascular:     Rate and Rhythm: Normal rate and regular rhythm.     Pulses: Normal pulses.     Heart sounds:  Normal heart sounds.  Pulmonary:     Effort: Pulmonary effort is normal.     Breath sounds: Normal breath sounds.  Abdominal:     General: Abdomen is flat. Bowel sounds are normal.     Palpations: Abdomen is soft.     Tenderness: There is generalized abdominal tenderness.  Musculoskeletal:        General: Normal range of motion.     Cervical back: Normal range of motion and neck supple.  Skin:    General: Skin is warm.     Capillary Refill: Capillary refill takes less than 2 seconds.  Neurological:     General: No focal deficit present.     Mental Status: She is alert and oriented to person, place, and time.  Psychiatric:        Mood and Affect: Mood normal.        Behavior: Behavior normal.        Thought Content: Thought content normal.        Judgment: Judgment normal.    ED Results / Procedures / Treatments   Labs (all labs ordered are  listed, but only abnormal results are displayed) Labs Reviewed  CBC WITH DIFFERENTIAL/PLATELET - Abnormal; Notable for the following components:      Result Value   WBC 12.3 (*)    Neutro Abs 10.0 (*)    All other components within normal limits  COMPREHENSIVE METABOLIC PANEL - Abnormal; Notable for the following components:   Potassium 3.3 (*)    CO2 19 (*)    Glucose, Bld 119 (*)    All other components within normal limits  URINALYSIS, ROUTINE W REFLEX MICROSCOPIC - Abnormal; Notable for the following components:   Color, Urine AMBER (*)    APPearance HAZY (*)    Ketones, ur 20 (*)    All other components within normal limits  PREGNANCY, URINE    EKG None  Radiology CT ABDOMEN PELVIS W CONTRAST  Result Date: 09/09/2021 CLINICAL DATA:  Cramping, abdominal pain, nausea, vomiting EXAM: CT ABDOMEN AND PELVIS WITH CONTRAST TECHNIQUE: Multidetector CT imaging of the abdomen and pelvis was performed using the standard protocol following bolus administration of intravenous contrast. RADIATION DOSE REDUCTION: This exam was performed according to the departmental dose-optimization program which includes automated exposure control, adjustment of the mA and/or kV according to patient size and/or use of iterative reconstruction technique. CONTRAST:  24mL OMNIPAQUE IOHEXOL 350 MG/ML SOLN IV. No oral contrast. COMPARISON:  01/09/2021 FINDINGS: Lower chest: Lung bases clear Hepatobiliary: Fatty infiltration of liver. No focal hepatic abnormalities. Gallbladder unremarkable. Pancreas: Normal appearance Spleen: Normal appearance Adrenals/Urinary Tract: Normal appearances of adrenal glands, kidneys, ureters, and bladder Stomach/Bowel: Appendix not visualized. Stomach and bowel loops normal appearance. Vascular/Lymphatic: Vascular structures patent.  No adenopathy. Reproductive: Unremarkable uterus and adnexa Other: No free air or free fluid.  No hernia. Musculoskeletal: Unremarkable IMPRESSION: Fatty  infiltration of liver. Otherwise negative exam. Electronically Signed   By: Ulyses Southward M.D.   On: 09/09/2021 14:05   US PELVIC COMPLETE W TRANSVAGINAL AND TORSION R/O  Result Date: 09/09/2021 CLINICAL DATA:  Pelvic pain EXAM: TRANSABDOMINAL ULTRASOUND OF PELVIS DOPPLER ULTRASOUND OF OVARIES TECHNIQUE: Transabdominal ultrasound examination of the pelvis was performed including evaluation of the uterus, ovaries, adnexal regions, and pelvic cul-de-sac. Color and duplex Doppler ultrasound was utilized to evaluate blood flow to the ovaries. COMPARISON:  None. FINDINGS: Uterus Measurements: 6.4 x 3.4 x 4.3 cm = volume: 50 mL. No fibroids or other mass visualized.  Endometrium Thickness: 4 mm.  No focal abnormality visualized. Right ovary Measurements: 2.9 x 1.5 x 2.2 cm = volume: 5 mL. Dominant follicle with daughter cyst benign in etiology (image 48:87). Normal appearance/no adnexal mass. Left ovary Measurements: 3 x 1.6 x 1.4 cm = volume: 3 mL. Normal appearance/no adnexal mass. Pulsed Doppler evaluation demonstrates normal low-resistance arterial and venous waveforms in both ovaries. Other: No free fluid. IMPRESSION: Unremarkable pelvic ultrasound. Electronically Signed   By: Tish FredericksonMorgane  Naveau M.D.   On: 09/09/2021 15:33    Procedures Procedures    Medications Ordered in ED Medications  morphine (PF) 2 MG/ML injection 2 mg (2 mg Intravenous Patient Refused/Not Given 09/09/21 1627)  ondansetron (ZOFRAN) injection 4 mg (4 mg Intravenous Patient Refused/Not Given 09/09/21 1627)  ondansetron (ZOFRAN-ODT) disintegrating tablet 4 mg (4 mg Oral Given 09/09/21 1041)  sodium chloride 0.9 % bolus 1,000 mL (0 mLs Intravenous Stopped 09/09/21 1405)  ketorolac (TORADOL) 30 MG/ML injection 30 mg (30 mg Intravenous Given 09/09/21 1143)  ondansetron (ZOFRAN) injection 4 mg (4 mg Intravenous Given 09/09/21 1143)  morphine (PF) 4 MG/ML injection 4 mg (4 mg Intravenous Given 09/09/21 1155)  iohexol (OMNIPAQUE) 350 MG/ML  injection 80 mL (80 mLs Intravenous Contrast Given 09/09/21 1358)    ED Course/ Medical Decision Making/ A&P                           Medical Decision Making Amount and/or Complexity of Data Reviewed Radiology: ordered. ECG/medicine tests: ordered.  Risk Prescription drug management.   This patient presents to the ED for concern of abdominal pain, this involves an extensive number of treatment options, and is a complaint that carries with it a high risk of complications and morbidity.  The differential diagnosis includes appendicitis, ovarian, endometriosis, uti, pregnancy   Co morbidities that complicate the patient evaluation  none   Additional history obtained:  Additional history obtained from epic chart review External records from outside source obtained and reviewed including mom   Lab Tests:  I Ordered, and personally interpreted labs.  The pertinent results include:  wbc slightly elevated, cmp ok, ua ok, preg neg   Imaging Studies ordered:  I ordered imaging studies including ct abd/pelvis and pelvic us  I independently visualized and interpreted imaging which showed nothing acute I agree with the radiologist interpretation   Cardiac Monitoring:  The patient was maintained on a cardiac monitor.  I personally viewed and interpreted the cardiac monitored which showed an underlying rhythm of: nsr   Medicines ordered and prescription drug management:  I ordered medication including toradol, morphine, zofran  for pain and nausea  Reevaluation of the patient after these medicines showed that the patient improved I have reviewed the patients home medicines and have made adjustments as needed  Critical Interventions:  IVFs and meds  Problem List / ED Course:  Abd pain:  pain has been going on for a year.  It occurs mainly when she is on her periods.  Ct and us ok.  Suspect possible endometriosis.  Pt will need to f/u with obgyn for further mgmt.  Pt's mom is  much more calm and apologized for her behavior.   Reevaluation:  After the interventions noted above, I reevaluated the patient and found that they have :improved   Dispostion:  After consideration of the diagnostic results and the patients response to treatment, I feel that the patent would benefit from discharge with outpatient f/u.  Final Clinical Impression(s) / ED Diagnoses Final diagnoses:  Generalized abdominal pain    Rx / DC Orders ED Discharge Orders          Ordered    HYDROcodone-acetaminophen (NORCO/VICODIN) 5-325 MG tablet  Every 4 hours PRN        09/09/21 1545    ibuprofen (ADVIL) 600 MG tablet  Every 6 hours PRN        09/09/21 1545    ondansetron (ZOFRAN-ODT) 4 MG disintegrating tablet  Every 8 hours PRN        09/09/21 1545              Jacalyn Lefevre, MD 09/09/21 1649

## 2021-09-09 NOTE — ED Notes (Signed)
Patient transported to Ultrasound 

## 2021-09-09 NOTE — ED Notes (Signed)
Patient transported to CT 

## 2022-01-21 DIAGNOSIS — R112 Nausea with vomiting, unspecified: Secondary | ICD-10-CM | POA: Diagnosis not present

## 2022-01-21 DIAGNOSIS — R102 Pelvic and perineal pain: Secondary | ICD-10-CM | POA: Diagnosis not present

## 2022-02-12 DIAGNOSIS — Z01818 Encounter for other preprocedural examination: Secondary | ICD-10-CM | POA: Diagnosis not present

## 2022-02-17 ENCOUNTER — Other Ambulatory Visit: Payer: Self-pay

## 2022-02-17 ENCOUNTER — Encounter (HOSPITAL_BASED_OUTPATIENT_CLINIC_OR_DEPARTMENT_OTHER): Payer: Self-pay | Admitting: Obstetrics and Gynecology

## 2022-02-17 NOTE — Progress Notes (Signed)
Spoke w/ via phone for pre-op interview---pt Lab needs dos----  cbc, urine preg             Lab results------ COVID test -----patient states asymptomatic no test needed Arrive at -------530 am 02-25-2022 NPO after MN NO Solid Food.  Clear liquids from MN until---430 am Med rec completed Medications to take morning of surgery -----none Diabetic medication -----n/a Patient instructed no nail polish to be worn day of surgery Patient instructed to bring photo id and insurance card day of surgery Patient aware to have Driver (ride ) / caregiver  mother Sarah Cabrera   for 24 hours after surgery  Patient Special Instructions -----none Pre-Op special Istructions -----none Patient verbalized understanding of instructions that were given at this phone interview. Patient denies shortness of breath, chest pain, fever, cough at this phone interview.

## 2022-02-24 NOTE — H&P (Signed)
Sarah Cabrera is an 21 y.o. female. I saw her in March for persistent pelvic pain when had menses on extended cycle OCP, suspicious for endometriosis.  At that visit, changed her to continuous OCP.  Since then, minimal spotting, but still has pain , nausea and vomiting every 3 months or so.  She has been sexually active without problems, negative GC/CT, normal pelvic ultrasound and CT of abd/pelvis last year  Pertinent Gynecological History: OB History: G0  Menstrual History: Patient's last menstrual period was 01/21/2022.    Past Medical History:  Diagnosis Date   Pelvic pain    Wears glasses     Past Surgical History:  Procedure Laterality Date   APPENDECTOMY     age 52    Family History  Problem Relation Age of Onset   Healthy Mother    Cancer Father     Social History:  reports that she has never smoked. She has never used smokeless tobacco. She reports current drug use. Drug: Marijuana. She reports that she does not drink alcohol.  Allergies:  Allergies  Allergen Reactions   Morphine     "Twitching"    No medications prior to admission.    Review of Systems  Respiratory: Negative.    Cardiovascular: Negative.     Height 5\' 5"  (1.651 m), weight 57.2 kg, last menstrual period 01/21/2022. Physical Exam Constitutional:      Appearance: Normal appearance.  Cardiovascular:     Rate and Rhythm: Normal rate and regular rhythm.     Heart sounds: Normal heart sounds. No murmur heard. Pulmonary:     Effort: Pulmonary effort is normal. No respiratory distress.     Breath sounds: Normal breath sounds. No wheezing.  Abdominal:     General: There is no distension.     Palpations: Abdomen is soft. There is no mass.     Tenderness: There is no abdominal tenderness. There is no guarding.  Genitourinary:    General: Normal vulva.     Comments: Uterus normal No adnexal mass Musculoskeletal:     Cervical back: Normal range of motion and neck supple.  Neurological:      Mental Status: She is alert.     No results found for this or any previous visit (from the past 24 hour(s)).  No results found.  Assessment/Plan: Persistent pelvic pain on continuous OCP, has had symptoms suspicious for endometriosis.  Discussed medical and surgical options, she wants to proceed with diagnostic laparoscopy.  Surgical procedure, risks, alternatives, chances of helping her symptoms all discussed.  Will admit for diagnostic/possible operative laparoscopy  03/24/2022 Sarah Cabrera 02/24/2022, 7:48 PM

## 2022-02-24 NOTE — Anesthesia Preprocedure Evaluation (Signed)
Anesthesia Evaluation  Patient identified by MRN, date of birth, ID band Patient awake    Reviewed: Allergy & Precautions, NPO status , Patient's Chart, lab work & pertinent test results  History of Anesthesia Complications Negative for: history of anesthetic complications  Airway Mallampati: I  TM Distance: >3 FB Neck ROM: Full    Dental  (+) Teeth Intact, Dental Advisory Given   Pulmonary neg pulmonary ROS, Patient abstained from smoking.,    Pulmonary exam normal        Cardiovascular negative cardio ROS Normal cardiovascular exam     Neuro/Psych negative neurological ROS     GI/Hepatic negative GI ROS, Neg liver ROS,   Endo/Other  negative endocrine ROS  Renal/GU negative Renal ROS  negative genitourinary   Musculoskeletal negative musculoskeletal ROS (+)   Abdominal   Peds  Hematology negative hematology ROS (+)   Anesthesia Other Findings   Reproductive/Obstetrics                            Anesthesia Physical Anesthesia Plan  ASA: 1  Anesthesia Plan: General   Post-op Pain Management: Tylenol PO (pre-op)* and Toradol IV (intra-op)*   Induction: Intravenous  PONV Risk Score and Plan: 4 or greater and Ondansetron, Dexamethasone, Treatment may vary due to age or medical condition and Midazolam  Airway Management Planned: Oral ETT  Additional Equipment: None  Intra-op Plan:   Post-operative Plan: Extubation in OR  Informed Consent: I have reviewed the patients History and Physical, chart, labs and discussed the procedure including the risks, benefits and alternatives for the proposed anesthesia with the patient or authorized representative who has indicated his/her understanding and acceptance.     Dental advisory given  Plan Discussed with:   Anesthesia Plan Comments:        Anesthesia Quick Evaluation

## 2022-02-25 ENCOUNTER — Other Ambulatory Visit: Payer: Self-pay

## 2022-02-25 ENCOUNTER — Ambulatory Visit (HOSPITAL_BASED_OUTPATIENT_CLINIC_OR_DEPARTMENT_OTHER): Payer: BLUE CROSS/BLUE SHIELD | Admitting: Anesthesiology

## 2022-02-25 ENCOUNTER — Ambulatory Visit (HOSPITAL_BASED_OUTPATIENT_CLINIC_OR_DEPARTMENT_OTHER)
Admission: RE | Admit: 2022-02-25 | Discharge: 2022-02-25 | Disposition: A | Discharge status: Home / Self Care | Payer: BLUE CROSS/BLUE SHIELD | Attending: Obstetrics and Gynecology | Admitting: Obstetrics and Gynecology

## 2022-02-25 ENCOUNTER — Encounter (HOSPITAL_BASED_OUTPATIENT_CLINIC_OR_DEPARTMENT_OTHER)
Admission: RE | Disposition: A | Discharge status: Home / Self Care | Payer: Self-pay | Source: Home / Self Care | Attending: Obstetrics and Gynecology

## 2022-02-25 ENCOUNTER — Encounter (HOSPITAL_BASED_OUTPATIENT_CLINIC_OR_DEPARTMENT_OTHER): Payer: Self-pay | Admitting: Obstetrics and Gynecology

## 2022-02-25 DIAGNOSIS — Z01818 Encounter for other preprocedural examination: Secondary | ICD-10-CM

## 2022-02-25 DIAGNOSIS — F1290 Cannabis use, unspecified, uncomplicated: Secondary | ICD-10-CM | POA: Diagnosis not present

## 2022-02-25 DIAGNOSIS — R102 Pelvic and perineal pain: Secondary | ICD-10-CM | POA: Insufficient documentation

## 2022-02-25 DIAGNOSIS — N809 Endometriosis, unspecified: Secondary | ICD-10-CM | POA: Diagnosis not present

## 2022-02-25 HISTORY — DX: Presence of spectacles and contact lenses: Z97.3

## 2022-02-25 HISTORY — DX: Pelvic and perineal pain: R10.2

## 2022-02-25 HISTORY — PX: LAPAROSCOPY: SHX197

## 2022-02-25 HISTORY — DX: Pelvic and perineal pain unspecified side: R10.20

## 2022-02-25 LAB — CBC
HCT: 37.3 % (ref 36.0–46.0)
Hemoglobin: 12.2 g/dL (ref 12.0–15.0)
MCH: 28.6 pg (ref 26.0–34.0)
MCHC: 32.7 g/dL (ref 30.0–36.0)
MCV: 87.4 fL (ref 80.0–100.0)
Platelets: 173 10*3/uL (ref 150–400)
RBC: 4.27 MIL/uL (ref 3.87–5.11)
RDW: 14.9 % (ref 11.5–15.5)
WBC: 8.8 10*3/uL (ref 4.0–10.5)
nRBC: 0 % (ref 0.0–0.2)

## 2022-02-25 LAB — POCT PREGNANCY, URINE: Preg Test, Ur: NEGATIVE

## 2022-02-25 SURGERY — LAPAROSCOPY, DIAGNOSTIC
Anesthesia: General | Site: Abdomen

## 2022-02-25 MED ORDER — DEXAMETHASONE SODIUM PHOSPHATE 10 MG/ML IJ SOLN
INTRAMUSCULAR | Status: DC | PRN
  Administered 2022-02-25: 5 mg via INTRAVENOUS

## 2022-02-25 MED ORDER — PROPOFOL 10 MG/ML IV BOLUS
INTRAVENOUS | Status: DC | PRN
  Administered 2022-02-25: 200 mg via INTRAVENOUS

## 2022-02-25 MED ORDER — FENTANYL CITRATE (PF) 100 MCG/2ML IJ SOLN
25.0000 ug | INTRAMUSCULAR | Status: DC | PRN
  Administered 2022-02-25: 25 ug via INTRAVENOUS

## 2022-02-25 MED ORDER — ONDANSETRON HCL 4 MG/2ML IJ SOLN: 4.0000 mg | Freq: Once | INTRAMUSCULAR | Status: DC | PRN

## 2022-02-25 MED ORDER — DEXAMETHASONE SODIUM PHOSPHATE 10 MG/ML IJ SOLN
INTRAMUSCULAR | Status: AC
  Filled 2022-02-25: qty 1

## 2022-02-25 MED ORDER — ONDANSETRON HCL 4 MG/2ML IJ SOLN
INTRAMUSCULAR | Status: AC
  Filled 2022-02-25: qty 2

## 2022-02-25 MED ORDER — ACETAMINOPHEN 500 MG PO TABS
1000.0000 mg | ORAL_TABLET | Freq: Once | ORAL | Status: AC
  Administered 2022-02-25: 1000 mg via ORAL

## 2022-02-25 MED ORDER — KETOROLAC TROMETHAMINE 30 MG/ML IJ SOLN
INTRAMUSCULAR | Status: DC | PRN
  Administered 2022-02-25: 30 mg via INTRAVENOUS

## 2022-02-25 MED ORDER — PROPOFOL 10 MG/ML IV BOLUS
INTRAVENOUS | Status: AC
  Filled 2022-02-25: qty 20

## 2022-02-25 MED ORDER — ONDANSETRON HCL 4 MG/2ML IJ SOLN
INTRAMUSCULAR | Status: DC | PRN
  Administered 2022-02-25: 5 mg via INTRAVENOUS

## 2022-02-25 MED ORDER — KETOROLAC TROMETHAMINE 30 MG/ML IJ SOLN
INTRAMUSCULAR | Status: AC
  Filled 2022-02-25: qty 1

## 2022-02-25 MED ORDER — BUPIVACAINE HCL (PF) 0.25 % IJ SOLN
INTRAMUSCULAR | Status: DC | PRN
  Administered 2022-02-25: 8 mL

## 2022-02-25 MED ORDER — SUGAMMADEX SODIUM 200 MG/2ML IV SOLN
INTRAVENOUS | Status: DC | PRN
  Administered 2022-02-25: 100 mg via INTRAVENOUS

## 2022-02-25 MED ORDER — 0.9 % SODIUM CHLORIDE (POUR BTL) OPTIME
TOPICAL | Status: DC | PRN
  Administered 2022-02-25: 500 mL

## 2022-02-25 MED ORDER — ROCURONIUM BROMIDE 10 MG/ML (PF) SYRINGE
PREFILLED_SYRINGE | INTRAVENOUS | Status: AC
  Filled 2022-02-25: qty 10

## 2022-02-25 MED ORDER — FENTANYL CITRATE (PF) 100 MCG/2ML IJ SOLN
INTRAMUSCULAR | Status: AC
  Filled 2022-02-25: qty 2

## 2022-02-25 MED ORDER — LIDOCAINE HCL (PF) 2 % IJ SOLN
INTRAMUSCULAR | Status: AC
  Filled 2022-02-25: qty 5

## 2022-02-25 MED ORDER — HYDROCODONE-ACETAMINOPHEN 5-325 MG PO TABS: 1.0000 | ORAL_TABLET | ORAL | 0 refills | Status: DC | PRN

## 2022-02-25 MED ORDER — MIDAZOLAM HCL 2 MG/2ML IJ SOLN
INTRAMUSCULAR | Status: DC | PRN
  Administered 2022-02-25: 2 mg via INTRAVENOUS

## 2022-02-25 MED ORDER — AMISULPRIDE (ANTIEMETIC) 5 MG/2ML IV SOLN: 10.0000 mg | Freq: Once | INTRAVENOUS | Status: DC | PRN

## 2022-02-25 MED ORDER — OXYCODONE HCL 5 MG PO TABS: 5.0000 mg | ORAL_TABLET | Freq: Once | ORAL | Status: DC | PRN

## 2022-02-25 MED ORDER — OXYCODONE HCL 5 MG/5ML PO SOLN: 5.0000 mg | Freq: Once | ORAL | Status: DC | PRN

## 2022-02-25 MED ORDER — ROCURONIUM BROMIDE 10 MG/ML (PF) SYRINGE
PREFILLED_SYRINGE | INTRAVENOUS | Status: DC | PRN
  Administered 2022-02-25: 50 mg via INTRAVENOUS

## 2022-02-25 MED ORDER — ACETAMINOPHEN 500 MG PO TABS
ORAL_TABLET | ORAL | Status: AC
  Filled 2022-02-25: qty 2

## 2022-02-25 MED ORDER — LACTATED RINGERS IV SOLN
INTRAVENOUS | Status: DC
Start: 2022-02-25 — End: 2022-02-25

## 2022-02-25 MED ORDER — LIDOCAINE 2% (20 MG/ML) 5 ML SYRINGE
INTRAMUSCULAR | Status: DC | PRN
  Administered 2022-02-25: 60 mg via INTRAVENOUS

## 2022-02-25 MED ORDER — FENTANYL CITRATE (PF) 100 MCG/2ML IJ SOLN
INTRAMUSCULAR | Status: DC | PRN
  Administered 2022-02-25: 50 ug via INTRAVENOUS

## 2022-02-25 MED ORDER — LACTATED RINGERS IV SOLN: INTRAVENOUS | Status: DC

## 2022-02-25 MED ORDER — MIDAZOLAM HCL 2 MG/2ML IJ SOLN
INTRAMUSCULAR | Status: AC
  Filled 2022-02-25: qty 2

## 2022-02-25 SURGICAL SUPPLY — 28 items
ADH SKN CLS APL DERMABOND .7 (GAUZE/BANDAGES/DRESSINGS) ×1
COVER MAYO STAND STRL (DRAPES) ×2 IMPLANT
DERMABOND ×1 IMPLANT
DERMABOND ADVANCED (GAUZE/BANDAGES/DRESSINGS) ×1
DERMABOND ADVANCED .7 DNX12 (GAUZE/BANDAGES/DRESSINGS) ×1 IMPLANT
DURAPREP 26ML APPLICATOR (WOUND CARE) ×2 IMPLANT
GAUZE 4X4 16PLY ~~LOC~~+RFID DBL (SPONGE) ×3 IMPLANT
GLOVE BIOGEL PI IND STRL 7.0 (GLOVE) IMPLANT
GLOVE BIOGEL PI IND STRL 8 (GLOVE) ×1 IMPLANT
GLOVE BIOGEL PI INDICATOR 7.0 (GLOVE) ×4
GLOVE BIOGEL PI INDICATOR 8 (GLOVE) ×1
GLOVE ORTHO TXT STRL SZ7.5 (GLOVE) ×3 IMPLANT
GOWN STRL REUS W/TWL XL LVL3 (GOWN DISPOSABLE) ×4 IMPLANT
KIT TURNOVER CYSTO (KITS) ×2 IMPLANT
MANIFOLD NEPTUNE II (INSTRUMENTS) ×1 IMPLANT
NDL INSUFFLATION 14GA 120MM (NEEDLE) ×1 IMPLANT
NEEDLE INSUFFLATION 14GA 120MM (NEEDLE) ×2 IMPLANT
NS IRRIG 500ML POUR BTL (IV SOLUTION) ×1 IMPLANT
PACK LAPAROSCOPY BASIN (CUSTOM PROCEDURE TRAY) ×2 IMPLANT
PACK TRENDGUARD 450 HYBRID PRO (MISCELLANEOUS) IMPLANT
SET TUBE SMOKE EVAC HIGH FLOW (TUBING) ×2 IMPLANT
SPIKE FLUID TRANSFER (MISCELLANEOUS) ×1 IMPLANT
SUT VIC AB 4-0 PS2 18 (SUTURE) ×2 IMPLANT
SUT VICRYL 0 UR6 27IN ABS (SUTURE) ×1 IMPLANT
TOWEL OR 17X26 10 PK STRL BLUE (TOWEL DISPOSABLE) ×2 IMPLANT
TRENDGUARD 450 HYBRID PRO PACK (MISCELLANEOUS) ×2
TROCAR Z-THREAD FIOS 5X100MM (TROCAR) ×3 IMPLANT
WARMER LAPAROSCOPE (MISCELLANEOUS) ×2 IMPLANT

## 2022-02-25 NOTE — Interval H&P Note (Signed)
History and Physical Interval Note:  02/25/2022 7:15 AM  [facility]  has presented today for surgery, with the diagnosis of pain in pelvis.  The various methods of treatment have been discussed with the patient and family. After consideration of risks, benefits and other options for treatment, the patient has consented to  Procedure(s) with comments: LAPAROSCOPY DIAGNOSTIC (N/A) - 1hr in OR for MD POSSIBLE LAPAROSCOPY OPERATIVE (N/A) as a surgical intervention.  The patient's history has been reviewed, patient examined, no change in status, stable for surgery.  I have reviewed the patient's chart and labs.  Questions were answered to the patient's satisfaction.     Sarah Cabrera [place]

## 2022-02-25 NOTE — Anesthesia Postprocedure Evaluation (Signed)
Anesthesia Post Note  Patient: Georgena Weisheit  Procedure(s) Performed: LAPAROSCOPY DIAGNOSTIC (Abdomen) LAPAROSCOPY OPERATIVE, FULGURATION OF POSSIBLE ENDOMETRIOSIS (Abdomen)     Patient location during evaluation: PACU Anesthesia Type: General Level of consciousness: awake and alert Pain management: pain level controlled Vital Signs Assessment: post-procedure vital signs reviewed and stable Respiratory status: spontaneous breathing, nonlabored ventilation and respiratory function stable Cardiovascular status: blood pressure returned to baseline and stable Postop Assessment: no apparent nausea or vomiting Anesthetic complications: no   No notable events documented.  Last Vitals:  Vitals:   02/25/22 0915 02/25/22 0948  BP: 100/65 103/61  Pulse: 60 61  Resp: 12 14  Temp:  36.8 C  SpO2: 100% 100%    Last Pain:  Vitals:   02/25/22 0948  TempSrc:   PainSc: 4                  Lucretia Kern

## 2022-02-25 NOTE — Transfer of Care (Signed)
Immediate Anesthesia Transfer of Care Note  Patient: Sarah Cabrera  Procedure(s) Performed: Procedure(s) (LRB): LAPAROSCOPY DIAGNOSTIC (N/A) LAPAROSCOPY OPERATIVE, FULGURATION OF POSSIBLE ENDOMETRIOSIS (N/A)  Patient Location: PACU  Anesthesia Type: General  Level of Consciousness: awake, oriented, sedated and patient cooperative  Airway & Oxygen Therapy: Patient Spontanous Breathing and Patient connected to face mask oxygen  Post-op Assessment: Report given to PACU RN and Post -op Vital signs reviewed and stable  Post vital signs: Reviewed and stable  Complications: No apparent anesthesia complications  Last Vitals:  Vitals Value Taken Time  BP 103/61 02/25/22 0948  Temp 36.8 C 02/25/22 0948  Pulse 61 02/25/22 0948  Resp 14 02/25/22 0948  SpO2 100 % 02/25/22 0948    Last Pain:  Vitals:   02/25/22 0948  TempSrc:   PainSc: 4       Patients Stated Pain Goal: 5 (02/25/22 0948)  Complications: No notable events documented.

## 2022-02-25 NOTE — Discharge Instructions (Addendum)
Routine instructions for laparoscopy   No ibuprofen, Advil, Aleve, Motrin, ketorolac, meloxicam, naproxen, or other NSAIDS until after 2:00 pm today if needed.   No acetaminophen/Tylenol until after 12:00 pm today if needed.    Post Anesthesia Home Care Instructions  Activity: Get plenty of rest for the remainder of the day. A responsible individual must stay with you for 24 hours following the procedure.  For the next 24 hours, DO NOT: -Drive a car -Advertising copywriter -Drink alcoholic beverages -Take any medication unless instructed by your physician -Make any legal decisions or sign important papers.  Meals: Start with liquid foods such as gelatin or soup. Progress to regular foods as tolerated. Avoid greasy, spicy, heavy foods. If nausea and/or vomiting occur, drink only clear liquids until the nausea and/or vomiting subsides. Call your physician if vomiting continues.  Special Instructions/Symptoms: Your throat may feel dry or sore from the anesthesia or the breathing tube placed in your throat during surgery. If this causes discomfort, gargle with warm salt water. The discomfort should disappear within 24 hours.

## 2022-02-25 NOTE — Anesthesia Procedure Notes (Signed)
Procedure Name: Intubation Date/Time: 02/25/2022 7:33 AM  Performed by: Suan Halter, CRNAPre-anesthesia Checklist: Patient identified, Emergency Drugs available, Suction available and Patient being monitored Patient Re-evaluated:Patient Re-evaluated prior to induction Oxygen Delivery Method: Circle system utilized Preoxygenation: Pre-oxygenation with 100% oxygen Induction Type: IV induction Ventilation: Mask ventilation without difficulty Laryngoscope Size: Mac and 3 Grade View: Grade I Tube type: Oral Tube size: 7.0 mm Number of attempts: 1 Airway Equipment and Method: Stylet and Oral airway Placement Confirmation: ETT inserted through vocal cords under direct vision, positive ETCO2 and breath sounds checked- equal and bilateral Secured at: 21 cm Tube secured with: Tape Dental Injury: Teeth and Oropharynx as per pre-operative assessment

## 2022-02-25 NOTE — Op Note (Signed)
Preoperative diagnosis: Pelvic pain  Postoperative diagnosis: Same, possible endometriosis Procedure: Diagnostic laparoscopy  Surgeon: Lavina Hamman M.D.  Anesthesia: Gen. Endotracheal tube  Findings: She had a normal abdomen and pelvis with normal uterus with a small left serosal myoma, normal tubes and ovaries, small adhesions from previous appendectomy, small brownish implants in each ovarian fossa c/w possible endometriosis Specimens: None  Estimated blood loss: Minimal  Complications: None  Procedure in detail:  The patient was taken to the operating room and placed in the dorsosupine position. General anesthesia was induced. Her legs were placed in mobile stirrups and her left arm was tucked to her side. Abdomen perineum and vagina were then prepped and draped in the usual sterile fashion, a Hulka tenaculum was applied to the cervix for uterine manipulation. Infraumbilical skin was then infiltrated with quarter percent Marcaine and a 1 cm horizontal incision was made. The veress needle was inserted into the peritoneal cavity and placement confirmed by the water drop test and an opening pressure of 4 mm of mercury. CO2 was insufflated to a pressure of 14 mm of mercury and the veress needle was removed. A 15mm disposable trocar was then introduced with direct visualization with the laparoscope. A 5 mm port was then placed on the left side also under direct visualization. Careful and thorough inspection revealed the above-mentioned findings. The few adhesions were taken down sharply and a small amount of bleeding controlled with bipolar.  The small spots of possible endometriosis were superficially fulgurated with bipolar.  No other source for pain was identified.  The 5 mm port on the left was removed under direct visualization. All gas was allowed to deflate from the abdomen and the umbilical trocar was removed. Skin incisions were then closed with interrupted subcuticular sutures of 4-0 Vicryl  followed by Dermabond. The Hulka tenaculum was removed. The patient was taken down from stirrups. She was awakened in the operating room and taken to the recovery room in stable condition after tolerating the procedure well. Counts were correct and she had PAS hose on throughout the procedure.

## 2022-02-26 ENCOUNTER — Encounter (HOSPITAL_BASED_OUTPATIENT_CLINIC_OR_DEPARTMENT_OTHER): Payer: Self-pay | Admitting: Obstetrics and Gynecology

## 2022-03-10 ENCOUNTER — Ambulatory Visit: Payer: BLUE CROSS/BLUE SHIELD | Admitting: Physician Assistant

## 2022-05-25 DIAGNOSIS — Z01419 Encounter for gynecological examination (general) (routine) without abnormal findings: Secondary | ICD-10-CM | POA: Diagnosis not present

## 2022-05-25 DIAGNOSIS — Z130 Encounter for screening for diseases of the blood and blood-forming organs and certain disorders involving the immune mechanism: Secondary | ICD-10-CM | POA: Diagnosis not present

## 2022-05-25 DIAGNOSIS — Z113 Encounter for screening for infections with a predominantly sexual mode of transmission: Secondary | ICD-10-CM | POA: Diagnosis not present

## 2022-12-29 ENCOUNTER — Encounter: Payer: BLUE CROSS/BLUE SHIELD | Admitting: Emergency Medicine

## 2023-01-13 IMAGING — US US PELVIS COMPLETE TRANSABD/TRANSVAG W DUPLEX
2 series · 13 of 25 positions shown · non-contrast
Comparison: CT abdomen and pelvis from January 09, 2021.

CLINICAL DATA: Bilateral pelvic pain for 1 month.

EXAM:
TRANSABDOMINAL AND TRANSVAGINAL ULTRASOUND OF PELVIS
DOPPLER ULTRASOUND OF OVARIES
TECHNIQUE: Both transabdominal and transvaginal ultrasound examinations of the
pelvis were performed. Transabdominal technique was performed for
global imaging of the pelvis including uterus, ovaries, adnexal
regions, and pelvic cul-de-sac.
It was necessary to proceed with endovaginal exam following the
transabdominal exam to visualize the LEFT and RIGHT ovary. Color
and duplex Doppler ultrasound was utilized to evaluate blood flow to
the ovaries.

[Series 1: us pelvic complete w transvaginal and torsion righ · 3 of 30 slices shown]
[im 1/30]
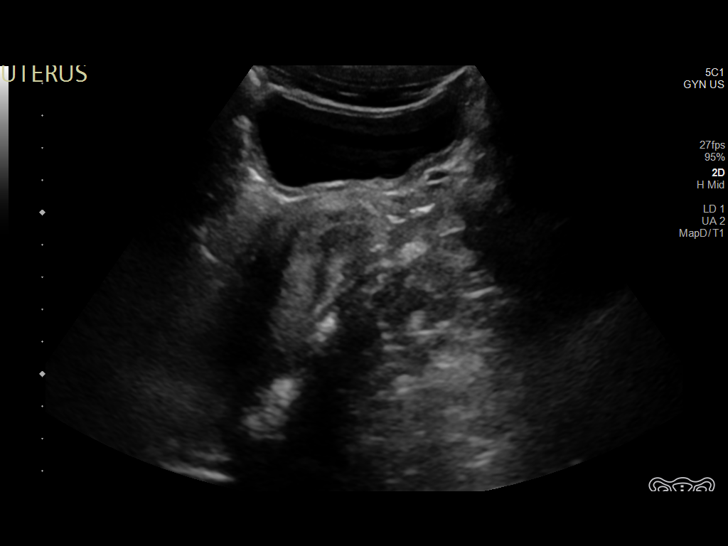
[im 12/30]
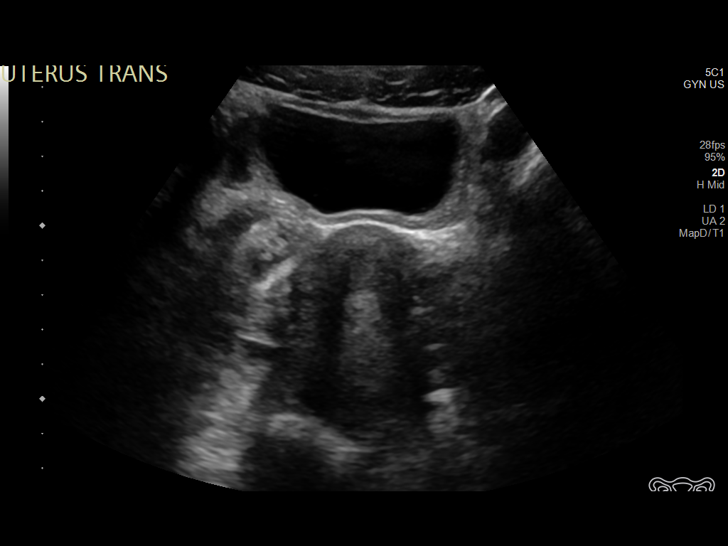
[im 24/30]
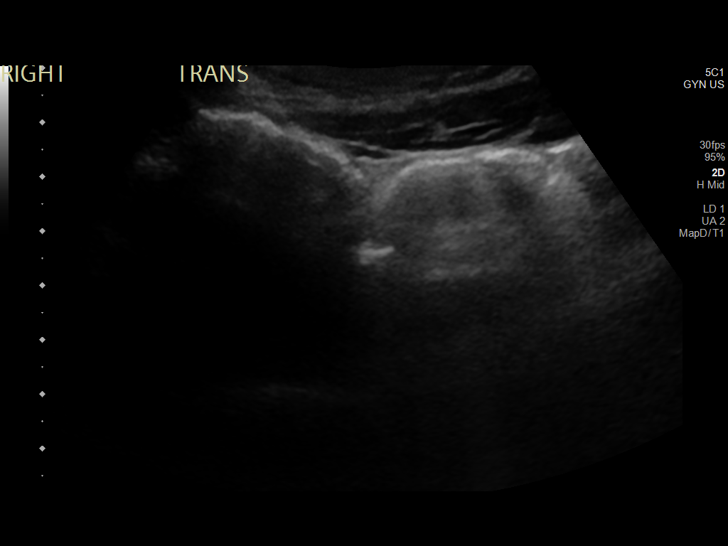

[Series 1001: gyn us · 10 of 90 slices shown]
[im 1/90]
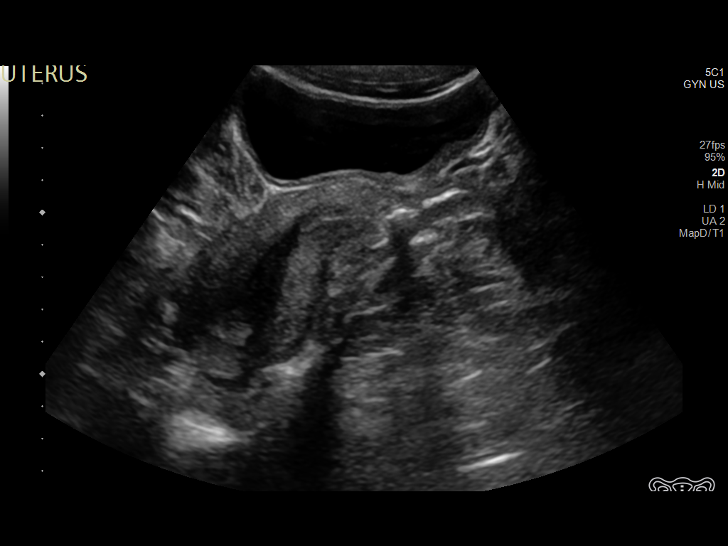
[im 10/90]
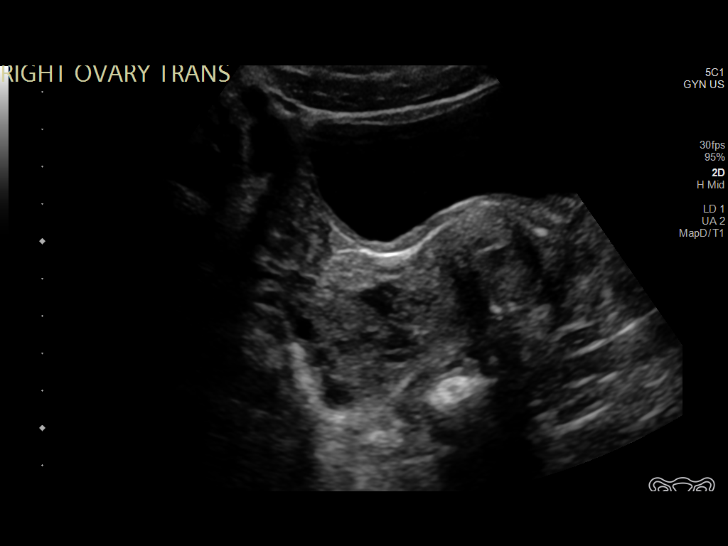
[im 20/90]
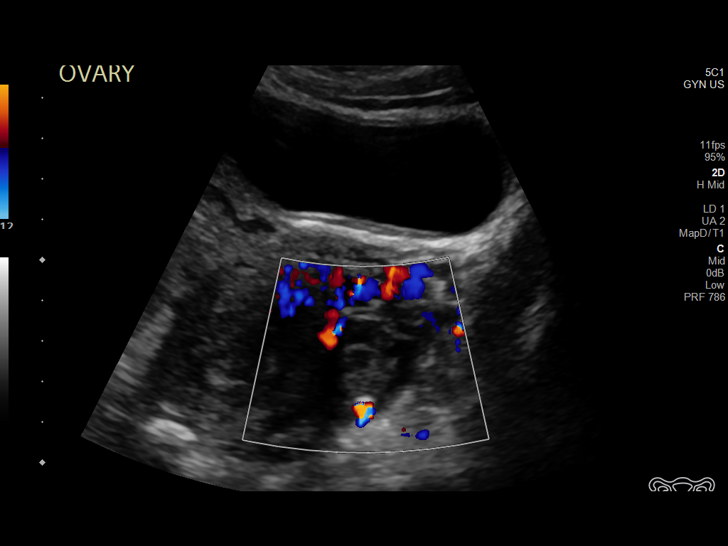
[im 30/90]
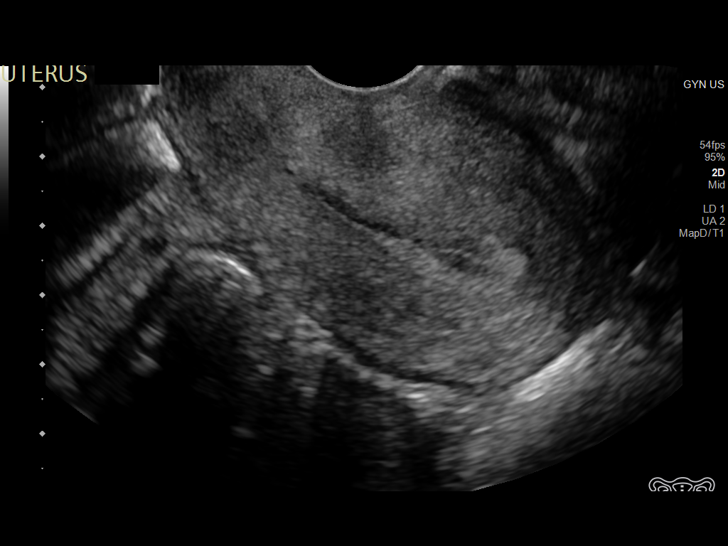
[im 40/90]
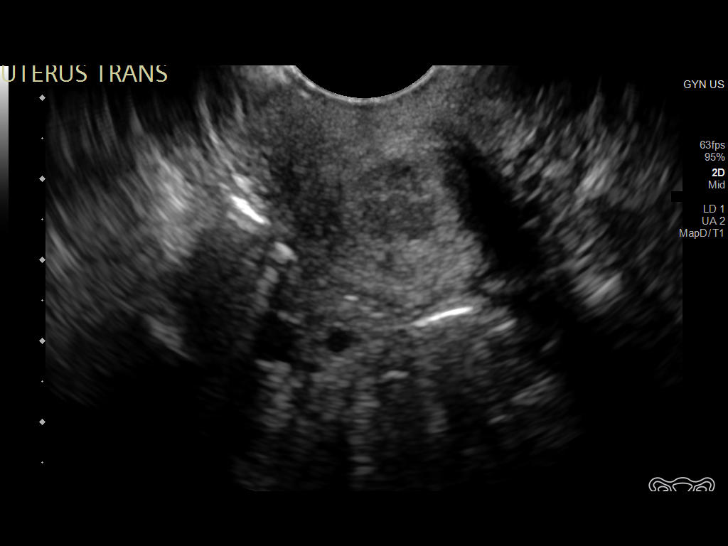
[im 50/90]
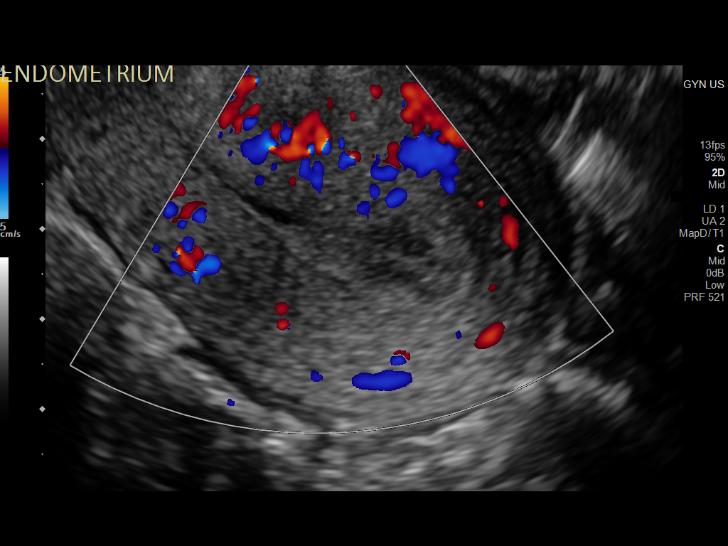
[im 60/90]
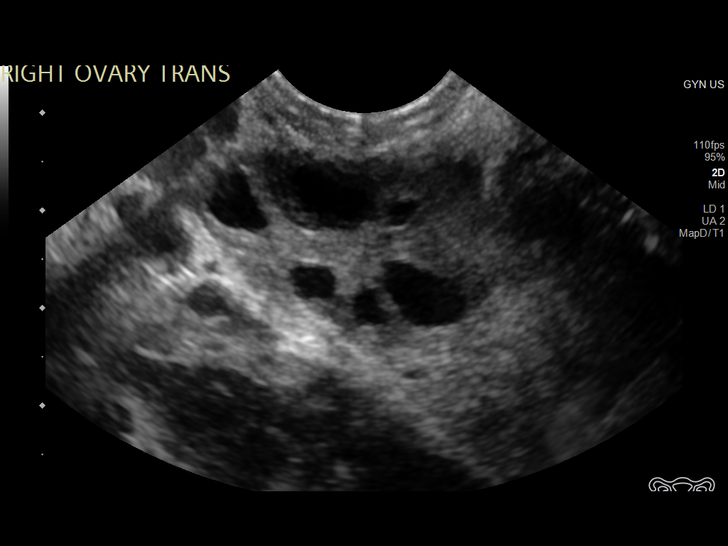
[im 70/90]
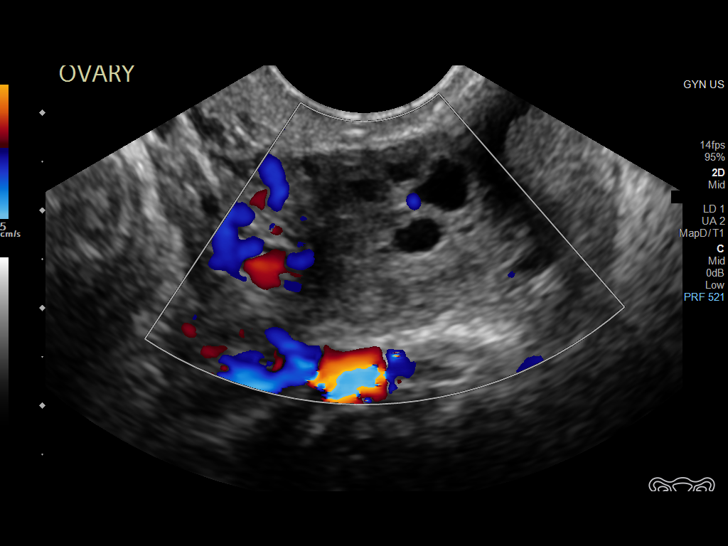
[im 80/90]
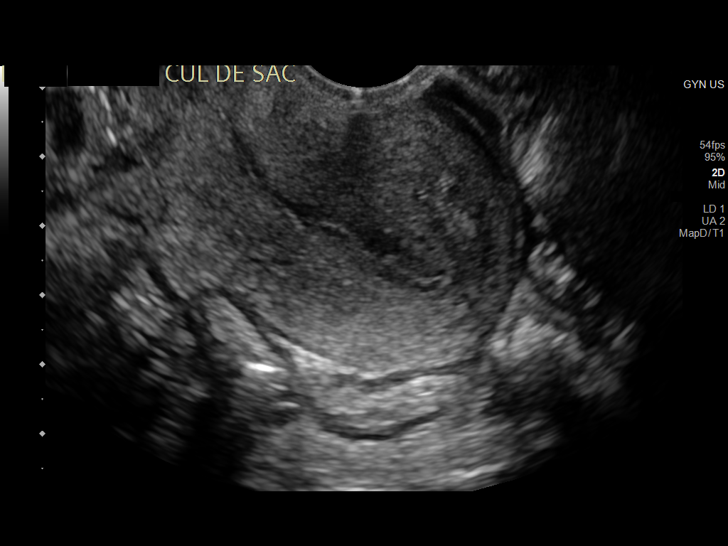
[im 90/90]
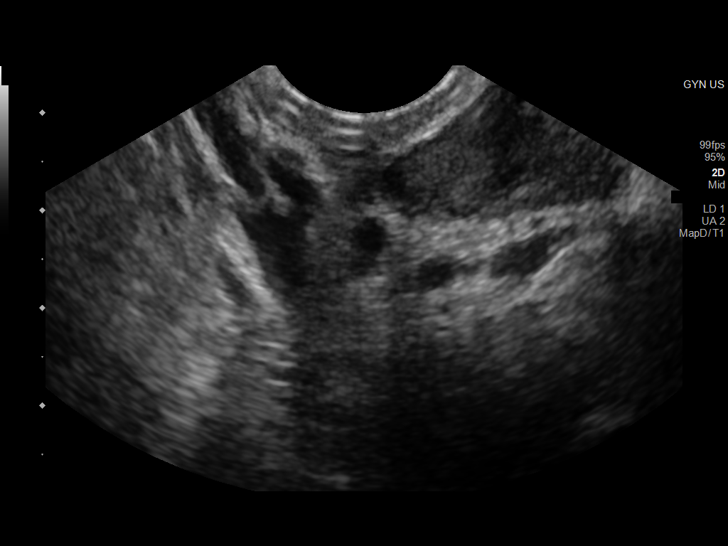

[13 of 25 positions shown; findings below may reference images not displayed]

FINDINGS: Uterus

Measurements: 8.0 x 4.2 x 4.0 cm = volume: 70.9 mL. Retroverted
uterus without focal abnormality.

Endometrium

Thickness: 12 mm.  No focal abnormality visualized.

Right ovary

Measurements: 3.3 x 1.6 x 2.6 cm = volume: 7.3 mL. Normal
appearance/no adnexal mass.

Left ovary

Measurements: 2.6 x 2.0 x 1.9 cm = volume: 5.1 mL. Normal
appearance/no adnexal mass.

Pulsed Doppler evaluation of both ovaries demonstrates normal
low-resistance arterial and venous waveforms.

Other findings

Trace free pelvic fluid.
IMPRESSION: Unremarkable pelvic sonogram without signs of ovarian torsion.

## 2023-09-12 IMAGING — CT CT ABD-PELV W/ CM
2 of 4 series · 16 of 46 positions shown, 18 images · IV contrast (agent unspecified)
Comparison: 01/09/2021

CLINICAL DATA: Cramping, abdominal pain, nausea, vomiting

EXAM:
CT ABDOMEN AND PELVIS WITH CONTRAST
TECHNIQUE: Multidetector CT imaging of the abdomen and pelvis was performed
using the standard protocol following bolus administration of
intravenous contrast.

[Series 3: abd/ pelvis 5.0 i30f 2 · axial · 0.75mm/px · z∈[+820,+1220]mm · 13 of 88 slices shown, 15 images]
[im 4/88  soft-tissue]
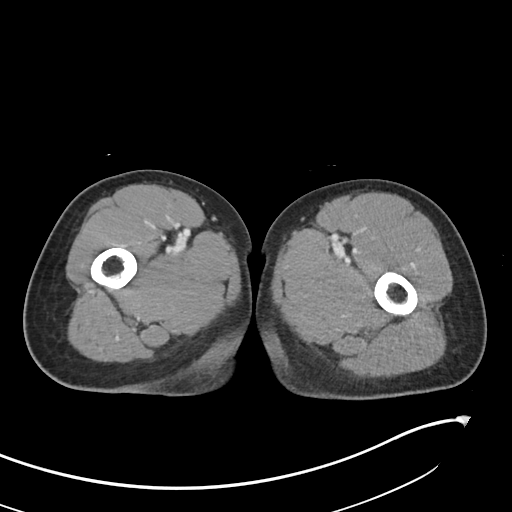
[im 4/88  bone]
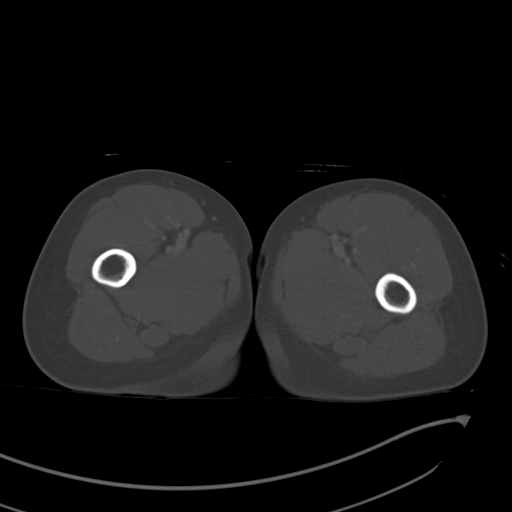
[im 12/88  soft-tissue]
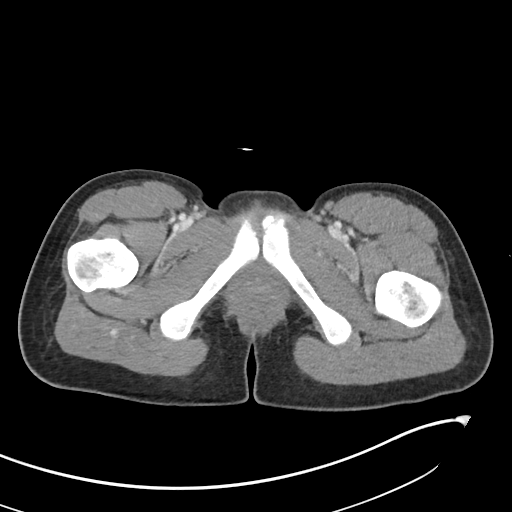
[im 20/88  soft-tissue]
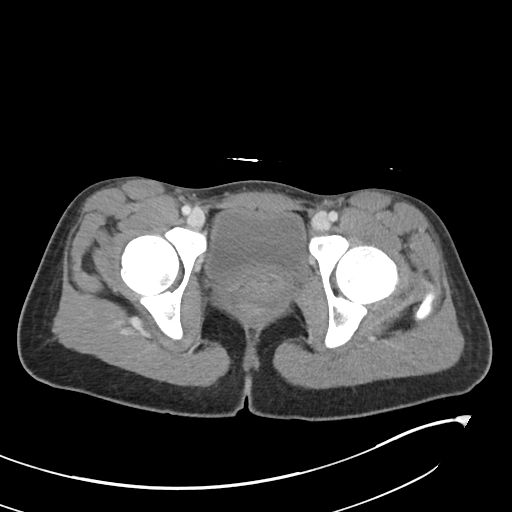
[im 24/88  soft-tissue]
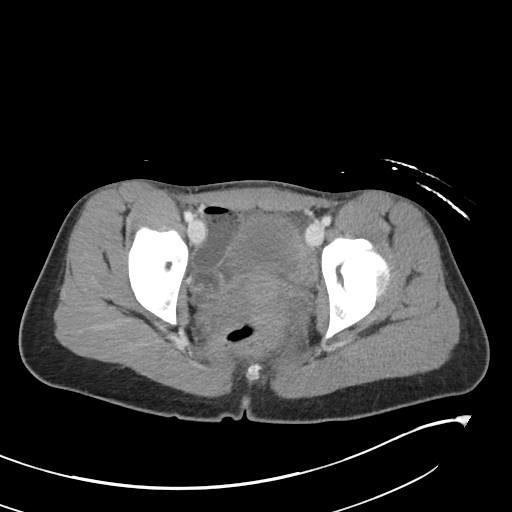
[im 32/88  soft-tissue]
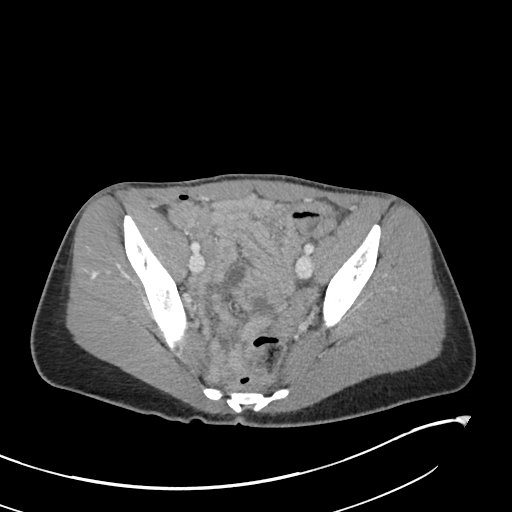
[im 36/88  soft-tissue]
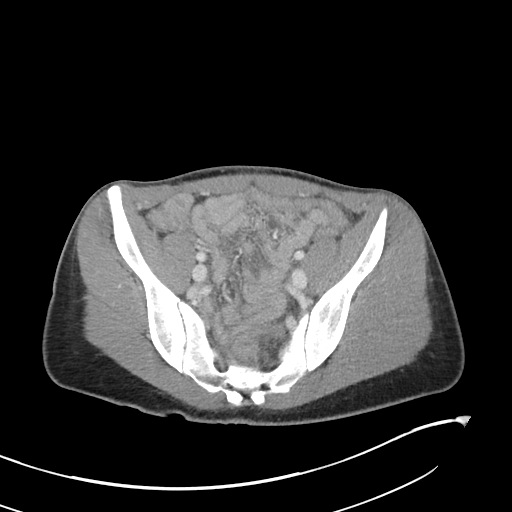
[im 44/88  soft-tissue]
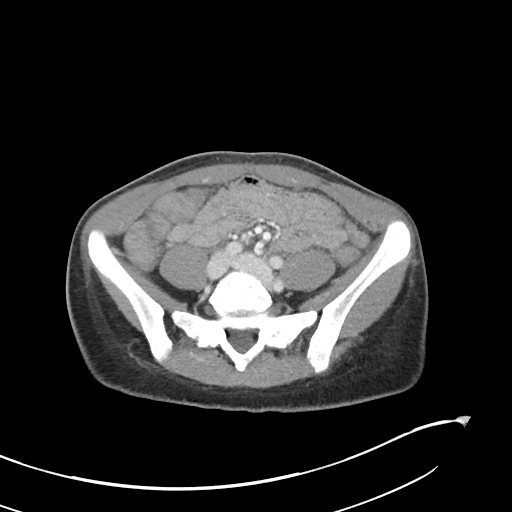
[im 52/88  soft-tissue]
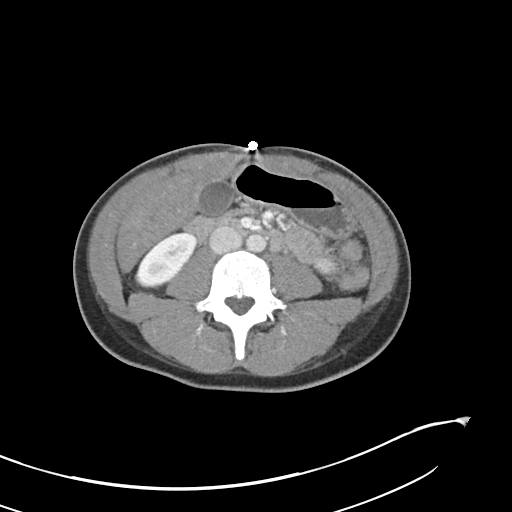
[im 56/88  soft-tissue]
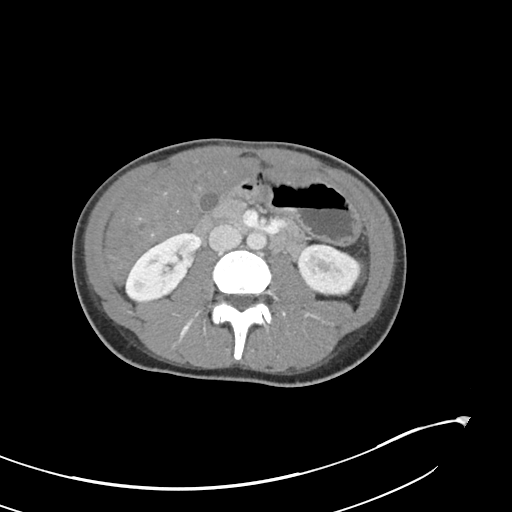
[im 56/88  bone]
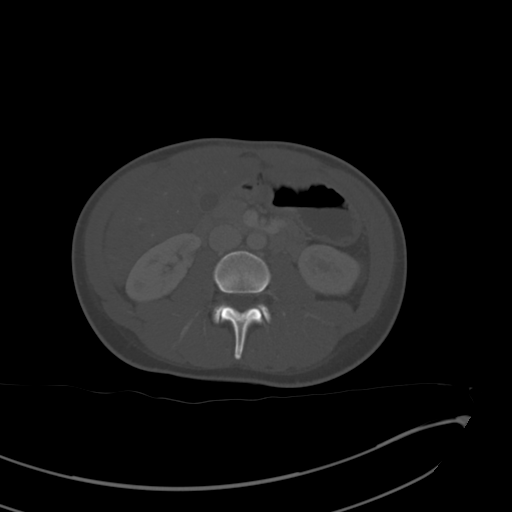
[im 64/88  soft-tissue]
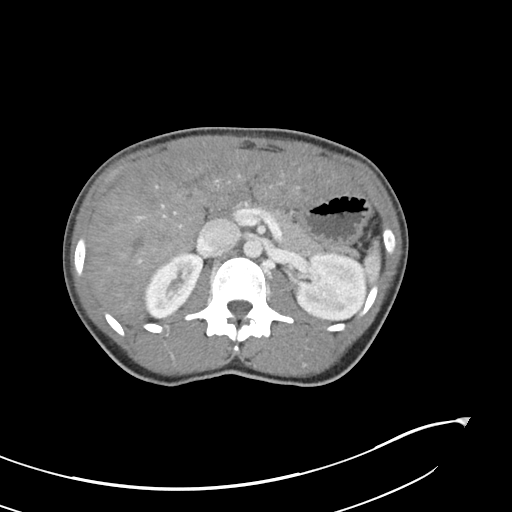
[im 68/88  soft-tissue]
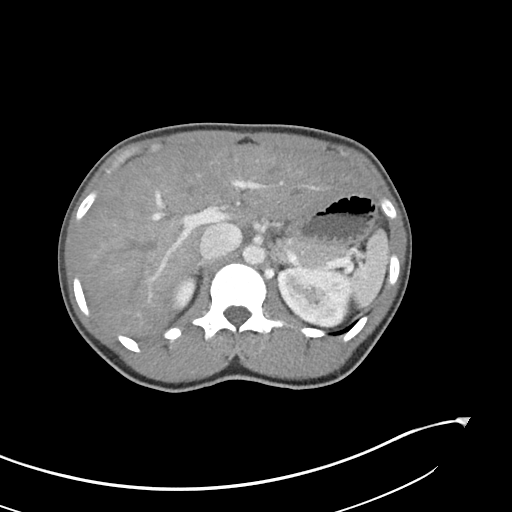
[im 76/88  soft-tissue]
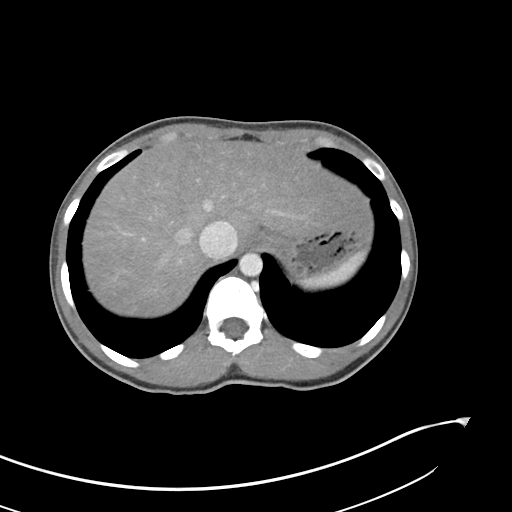
[im 84/88  soft-tissue]
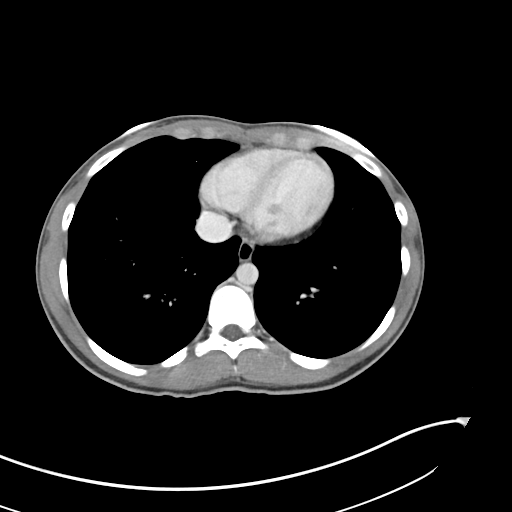

[Series 6: coronal soft tissue · coronal · 0.75mm/px · 3 of 93 slices shown]
[im 31/93  soft-tissue]
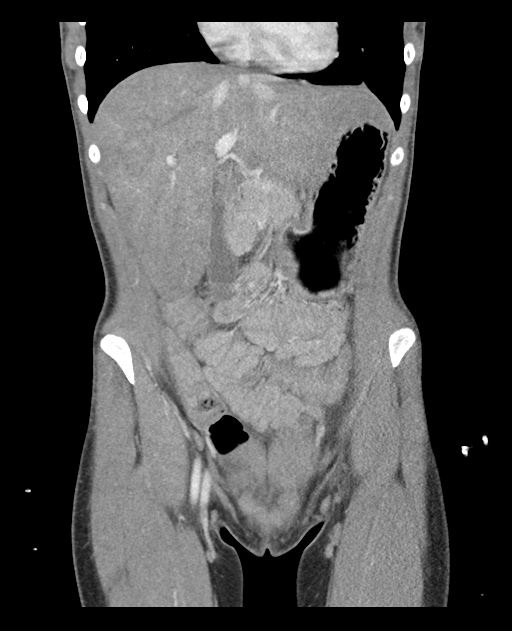
[im 41/93  soft-tissue]
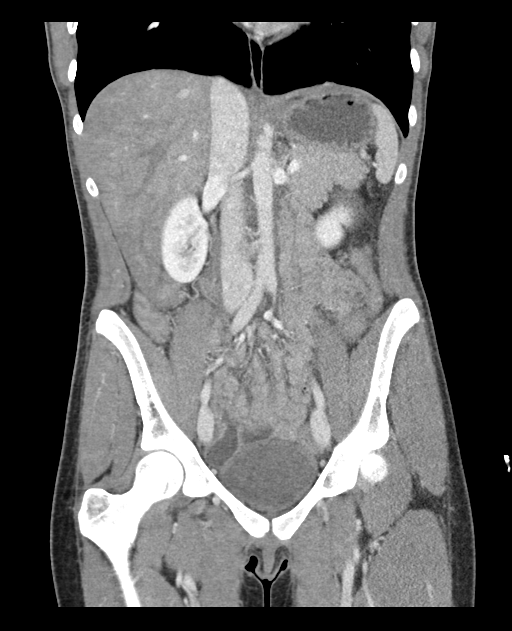
[im 52/93  soft-tissue]
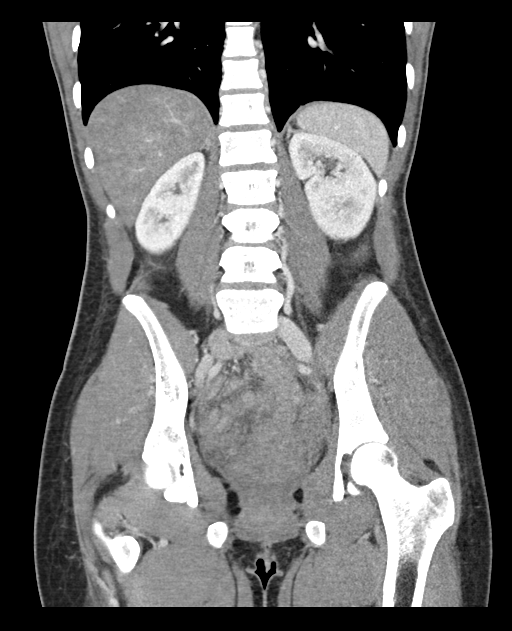

[16 of 46 positions shown; findings below may reference images not displayed]

RADIATION DOSE REDUCTION: This exam was performed according to the
departmental dose-optimization program which includes automated
exposure control, adjustment of the mA and/or kV according to
patient size and/or use of iterative reconstruction technique.

CONTRAST:  80mL OMNIPAQUE IOHEXOL 350 MG/ML SOLN IV. No oral
contrast.
FINDINGS: Lower chest: Lung bases clear

Hepatobiliary: Fatty infiltration of liver. No focal hepatic
abnormalities. Gallbladder unremarkable.

Pancreas: Normal appearance

Spleen: Normal appearance

Adrenals/Urinary Tract: Normal appearances of adrenal glands,
kidneys, ureters, and bladder

Stomach/Bowel: Appendix not visualized. Stomach and bowel loops
normal appearance.

Vascular/Lymphatic: Vascular structures patent.  No adenopathy.

Reproductive: Unremarkable uterus and adnexa

Other: No free air or free fluid.  No hernia.

Musculoskeletal: Unremarkable
IMPRESSION: Fatty infiltration of liver.

Otherwise negative exam.

## 2023-09-14 DIAGNOSIS — Z01419 Encounter for gynecological examination (general) (routine) without abnormal findings: Secondary | ICD-10-CM | POA: Diagnosis not present

## 2023-09-14 DIAGNOSIS — Z124 Encounter for screening for malignant neoplasm of cervix: Secondary | ICD-10-CM | POA: Diagnosis not present

## 2023-09-14 DIAGNOSIS — Z1389 Encounter for screening for other disorder: Secondary | ICD-10-CM | POA: Diagnosis not present

## 2023-09-14 DIAGNOSIS — Z113 Encounter for screening for infections with a predominantly sexual mode of transmission: Secondary | ICD-10-CM | POA: Diagnosis not present

## 2023-09-14 DIAGNOSIS — Z130 Encounter for screening for diseases of the blood and blood-forming organs and certain disorders involving the immune mechanism: Secondary | ICD-10-CM | POA: Diagnosis not present

## 2023-09-14 DIAGNOSIS — Z202 Contact with and (suspected) exposure to infections with a predominantly sexual mode of transmission: Secondary | ICD-10-CM | POA: Diagnosis not present

## 2023-10-24 ENCOUNTER — Ambulatory Visit: Admitting: Emergency Medicine

## 2023-10-25 ENCOUNTER — Ambulatory Visit: Admitting: Emergency Medicine

## 2023-10-25 ENCOUNTER — Encounter: Payer: Self-pay | Admitting: Emergency Medicine

## 2023-10-25 VITALS — BP 102/60 | HR 73 | Temp 98.1°F | Ht 65.0 in | Wt 135.0 lb

## 2023-10-25 DIAGNOSIS — R1084 Generalized abdominal pain: Secondary | ICD-10-CM

## 2023-10-25 LAB — URINALYSIS
Bilirubin Urine: NEGATIVE
Hgb urine dipstick: NEGATIVE
Ketones, ur: NEGATIVE
Leukocytes,Ua: NEGATIVE
Nitrite: NEGATIVE
Specific Gravity, Urine: 1.015 (ref 1.000–1.030)
Total Protein, Urine: NEGATIVE
Urine Glucose: NEGATIVE
Urobilinogen, UA: 1 (ref 0.0–1.0)
pH: 7 (ref 5.0–8.0)

## 2023-10-25 LAB — CBC WITH DIFFERENTIAL/PLATELET
Basophils Absolute: 0 10*3/uL (ref 0.0–0.1)
Basophils Relative: 0.3 % (ref 0.0–3.0)
Eosinophils Absolute: 0.1 10*3/uL (ref 0.0–0.7)
Eosinophils Relative: 1.3 % (ref 0.0–5.0)
HCT: 41.3 % (ref 36.0–46.0)
Hemoglobin: 13.2 g/dL (ref 12.0–15.0)
Lymphocytes Relative: 29.6 % (ref 12.0–46.0)
Lymphs Abs: 3.1 10*3/uL (ref 0.7–4.0)
MCHC: 32.1 g/dL (ref 30.0–36.0)
MCV: 89.6 fl (ref 78.0–100.0)
Monocytes Absolute: 0.8 10*3/uL (ref 0.1–1.0)
Monocytes Relative: 7.7 % (ref 3.0–12.0)
Neutro Abs: 6.3 10*3/uL (ref 1.4–7.7)
Neutrophils Relative %: 61.1 % (ref 43.0–77.0)
Platelets: 190 10*3/uL (ref 150.0–400.0)
RBC: 4.6 Mil/uL (ref 3.87–5.11)
RDW: 14.4 % (ref 11.5–15.5)
WBC: 10.3 10*3/uL (ref 4.0–10.5)

## 2023-10-25 LAB — COMPREHENSIVE METABOLIC PANEL WITH GFR
ALT: 15 U/L (ref 0–35)
AST: 20 U/L (ref 0–37)
Albumin: 4.4 g/dL (ref 3.5–5.2)
Alkaline Phosphatase: 64 U/L (ref 39–117)
BUN: 12 mg/dL (ref 6–23)
CO2: 24 meq/L (ref 19–32)
Calcium: 9.6 mg/dL (ref 8.4–10.5)
Chloride: 105 meq/L (ref 96–112)
Creatinine, Ser: 0.85 mg/dL (ref 0.40–1.20)
GFR: 97.67 mL/min (ref >60.00)
Glucose, Bld: 88 mg/dL (ref 70–99)
Potassium: 4.6 meq/L (ref 3.5–5.1)
Sodium: 138 meq/L (ref 135–145)
Total Bilirubin: 0.3 mg/dL (ref 0.2–1.2)
Total Protein: 7.8 g/dL (ref 6.0–8.3)

## 2023-10-25 LAB — LIPASE: Lipase: 12 U/L (ref 11.0–59.0)

## 2023-10-25 NOTE — Progress Notes (Signed)
 Sarah Cabrera 23 y.o.   Chief Complaint  Patient presents with   Abdominal Pain    Patient c/o of stomach pains that has been going on for awhile. She did have a LAPAROSCOPY done in 02/2023 and said she did had something removed that could POSSIBLE ENDOMETRIOSIS she did say it help for short time. The pain is back and is the same pain she had before. Cramping pain. Her last menstrual was 3 weeks ago. She is still on her birth control    HISTORY OF PRESENT ILLNESS: This is a 23 y.o. female complaining of upper and lower abdominal cramping, almost daily, for the past several weeks and months Denies associated symptoms Has a history of endometriosis on birth control pills. No other complaints or medical concerns today.  HPI   Prior to Admission medications   Medication Sig Start Date End Date Taking? Authorizing Provider  VIENVA 0.1-20 MG-MCG tablet Take 1 tablet by mouth daily. 10/14/23  Yes [provider]  HYDROcodone-acetaminophen (NORCO/VICODIN) 5-325 MG tablet Take 1 tablet by mouth every 4 (four) hours as needed for severe pain. 02/25/22   Meisinger, Tawanna Cooler, MD  Levonorgestrel-Ethinyl Estradiol (AMETHIA) 0.15-0.03 &0.01 MG tablet Take 1 tablet by mouth at bedtime.    [provider]    Allergies  Allergen Reactions   Morphine     "Twitching"    There are no active problems to display for this patient.   Past Medical History:  Diagnosis Date   Pelvic pain    Wears glasses     Past Surgical History:  Procedure Laterality Date   APPENDECTOMY     age 32   LAPAROSCOPY N/A 02/25/2022   Procedure: LAPAROSCOPY DIAGNOSTIC;  Surgeon: Lavina Hamman, MD;  Location: [facility] [place];  Service: Gynecology;  Laterality: N/A;   LAPAROSCOPY N/A 02/25/2022   Procedure: LAPAROSCOPY OPERATIVE, FULGURATION OF POSSIBLE ENDOMETRIOSIS;  Surgeon: Lavina Hamman, MD;  Location: [facility] [place];  Service: Gynecology;  Laterality: N/A;    Social History    Socioeconomic History   Marital status: Single    Spouse name: Not on file   Number of children: Not on file   Years of education: Not on file   Highest education level: 12th grade  Occupational History   Not on file  Tobacco Use   Smoking status: Never   Smokeless tobacco: Never  Vaping Use   Vaping status: Never Used  Substance and Sexual Activity   Alcohol use: Never   Drug use: Yes    Types: Marijuana    Comment: marijuan last used 1 week ago per pt on 02-17-2022   Sexual activity: Not on file  Other Topics Concern   Not on file  Social History Narrative   Not on file   Social Drivers of Health   Financial Resource Strain: Low Risk  (10/25/2023)   Overall Financial Resource Strain (CARDIA)    Difficulty of Paying Living Expenses: Not hard at all  Food Insecurity: No Food Insecurity (10/25/2023)   Hunger Vital Sign    Worried About Running Out of Food in the Last Year: Never true    Ran Out of Food in the Last Year: Never true  Transportation Needs: No Transportation Needs (10/25/2023)   PRAPARE - Administrator, Civil Service (Medical): No    Lack of Transportation (Non-Medical): No  Physical Activity: Insufficiently Active (10/25/2023)   Exercise Vital Sign    Days of Exercise per Week: 2 days  Minutes of Exercise per Session: 40 min  Stress: No Stress Concern Present (10/25/2023)   [employer] of Occupational Health - Occupational Stress Questionnaire    Feeling of Stress : Not at all  Social Connections: Socially Isolated (10/25/2023)   Social Connection and Isolation Panel [NHANES]    Frequency of Communication with Friends and Family: More than three times a week    Frequency of Social Gatherings with Friends and Family: Three times a week    Attends Religious Services: Never    Active Member of Clubs or Organizations: No    Attends Engineer, structural: Not on file    Marital Status: Never married  Intimate Partner Violence: Not on file     Family History  Problem Relation Age of Onset   Healthy Mother    Cancer Father      Review of Systems  Constitutional: Negative.  Negative for chills and fever.  HENT: Negative.  Negative for congestion and sore throat.   Respiratory: Negative.  Negative for cough and shortness of breath.   Cardiovascular: Negative.  Negative for chest pain and palpitations.  Gastrointestinal:  Positive for abdominal pain.  Genitourinary: Negative.  Negative for dysuria and hematuria.  Musculoskeletal: Negative.   Skin: Negative.  Negative for rash.  Neurological: Negative.  Negative for dizziness and headaches.  All other systems reviewed and are negative.   Vitals:   10/25/23 0930  BP: 102/60  Pulse: 73  Temp: 98.1 F (36.7 C)  SpO2: 99%    Physical Exam Vitals reviewed.  Constitutional:      Appearance: She is well-developed.  HENT:     Head: Normocephalic.     Mouth/Throat:     Mouth: Mucous membranes are moist.     Pharynx: Oropharynx is clear.  Eyes:     Extraocular Movements: Extraocular movements intact.     Conjunctiva/sclera: Conjunctivae normal.     Pupils: Pupils are equal, round, and reactive to light.  Cardiovascular:     Rate and Rhythm: Normal rate and regular rhythm.     Pulses: Normal pulses.     Heart sounds: Normal heart sounds.  Pulmonary:     Effort: Pulmonary effort is normal.     Breath sounds: Normal breath sounds.  Abdominal:     Palpations: Abdomen is soft.     Tenderness: There is no abdominal tenderness.  Musculoskeletal:     Cervical back: No tenderness.  Lymphadenopathy:     Cervical: No cervical adenopathy.  Skin:    General: Skin is warm and dry.     Capillary Refill: Capillary refill takes less than 2 seconds.  Neurological:     General: No focal deficit present.     Mental Status: She is alert and oriented to person, place, and time.  Psychiatric:        Mood and Affect: Mood normal.        Behavior: Behavior normal.       ASSESSMENT & PLAN: A total of 33 minutes was spent with the patient and counseling/coordination of care regarding preparing for this visit, reviewing most recent office visit notes, review of most recent blood work results, differential diagnosis of generalized abdominal pain and need for workup and GI evaluation, education on nutrition, pain management, ED precautions, prognosis, documentation, and need for follow-up.  Problem List Items Addressed This Visit       Other   Generalized abdominal pain - Primary   Clinically stable.  No red flag signs  or symptoms Benign abdominal examination.  Afebrile. Differential diagnosis discussed. Patient has history of endometriosis.  Needs follow-up with gynecologist. Upper and lower abdominal pain needs evaluation by GI doctors May need upper and lower endoscopies Referral placed today Diet and nutrition reviewed. Pain management discussed. Recommend blood work today. ED precautions given. Advised to stay well-hydrated.      Relevant Orders   CBC with Differential/Platelet   Comprehensive metabolic panel with GFR   Urinalysis   Pregnancy, urine   Lipase   Ambulatory referral to Gastroenterology   Patient Instructions  Abdominal Pain, Adult  Many things can cause belly (abdominal) pain. In most cases, belly pain is not a serious problem and can be watched and treated at home. But in some cases, it can be serious. Your doctor will try to find the cause of your belly pain. Follow these instructions at home: Medicines Take over-the-counter and prescription medicines only as told by your doctor. Do not take medicines that help you poop (laxatives) unless told by your doctor. General instructions Watch your belly pain for any changes. Tell your doctor if the pain gets worse. Drink enough fluid to keep your pee (urine) pale yellow. Contact a doctor if: Your belly pain changes or gets worse. You have very bad cramping or bloating  in your belly. You vomit. Your pain gets worse with meals, after eating, or with certain foods. You have trouble pooping or have watery poop for more than 2-3 days. You are not hungry, or you lose weight without trying. You have signs of not getting enough fluid or water (dehydration). These may include: Dark pee, very little pee, or no pee. Cracked lips or dry mouth. Feeling sleepy or weak. You have pain when you pee or poop. Your belly pain wakes you up at night. You have blood in your pee. You have a fever. Get help right away if: You cannot stop vomiting. Your pain is only in one part of your belly, like on the right side. You have bloody or black poop, or poop that looks like tar. You have trouble breathing. You have chest pain. These symptoms may be an emergency. Get help right away. Call 911. Do not wait to see if the symptoms will go away. Do not drive yourself to the hospital. This information is not intended to replace advice given to you by your health care provider. Make sure you discuss any questions you have with your health care provider. Document Revised: 04/21/2022 Document Reviewed: 04/21/2022 Elsevier Patient Education  2024 Elsevier Inc.   Edwina Barth, MD [facility]

## 2023-10-25 NOTE — Assessment & Plan Note (Signed)
 Clinically stable.  No red flag signs or symptoms Benign abdominal examination.  Afebrile. Differential diagnosis discussed. Patient has history of endometriosis.  Needs follow-up with gynecologist. Upper and lower abdominal pain needs evaluation by GI doctors May need upper and lower endoscopies Referral placed today Diet and nutrition reviewed. Pain management discussed. Recommend blood work today. ED precautions given. Advised to stay well-hydrated.

## 2023-10-25 NOTE — Patient Instructions (Signed)
 Abdominal Pain, Adult    Many things can cause belly (abdominal) pain. In most cases, belly pain is not a serious problem and can be watched and treated at home. But in some cases, it can be serious.  Your doctor will try to find the cause of your belly pain.  Follow these instructions at home:  Medicines  Take over-the-counter and prescription medicines only as told by your doctor.  Do not take medicines that help you poop (laxatives) unless told by your doctor.  General instructions  Watch your belly pain for any changes. Tell your doctor if the pain gets worse.  Drink enough fluid to keep your pee (urine) pale yellow.  Contact a doctor if:  Your belly pain changes or gets worse.  You have very bad cramping or bloating in your belly.  You vomit.  Your pain gets worse with meals, after eating, or with certain foods.  You have trouble pooping or have watery poop for more than 2-3 days.  You are not hungry, or you lose weight without trying.  You have signs of not getting enough fluid or water (dehydration). These may include:  Dark pee, very Chastain pee, or no pee.  Cracked lips or dry mouth.  Feeling sleepy or weak.  You have pain when you pee or poop.  Your belly pain wakes you up at night.  You have blood in your pee.  You have a fever.  Get help right away if:  You cannot stop vomiting.  Your pain is only in one part of your belly, like on the right side.  You have bloody or black poop, or poop that looks like tar.  You have trouble breathing.  You have chest pain.  These symptoms may be an emergency. Get help right away. Call 911.  Do not wait to see if the symptoms will go away.  Do not drive yourself to the hospital.  This information is not intended to replace advice given to you by your health care provider. Make sure you discuss any questions you have with your health care provider.  Document Revised: 04/21/2022 Document Reviewed: 04/21/2022  Elsevier Patient Education  2024 [employer].

## 2023-10-26 LAB — PREGNANCY, URINE: Preg Test, Ur: NEGATIVE

## 2024-02-09 ENCOUNTER — Encounter (HOSPITAL_COMMUNITY): Payer: Self-pay

## 2024-02-09 ENCOUNTER — Ambulatory Visit (HOSPITAL_COMMUNITY)
Admission: EM | Admit: 2024-02-09 | Discharge: 2024-02-09 | Disposition: A | Discharge status: Home / Self Care | Attending: Family Medicine | Admitting: Family Medicine

## 2024-02-09 ENCOUNTER — Emergency Department (HOSPITAL_COMMUNITY)

## 2024-02-09 ENCOUNTER — Emergency Department (HOSPITAL_COMMUNITY)
Admission: EM | Admit: 2024-02-09 | Discharge: 2024-02-09 | Disposition: A | Discharge status: Home / Self Care | Attending: Emergency Medicine | Admitting: Emergency Medicine

## 2024-02-09 ENCOUNTER — Other Ambulatory Visit: Payer: Self-pay

## 2024-02-09 ENCOUNTER — Encounter (HOSPITAL_COMMUNITY): Payer: Self-pay | Admitting: *Deleted

## 2024-02-09 DIAGNOSIS — R112 Nausea with vomiting, unspecified: Secondary | ICD-10-CM

## 2024-02-09 DIAGNOSIS — M545 Low back pain, unspecified: Secondary | ICD-10-CM | POA: Insufficient documentation

## 2024-02-09 DIAGNOSIS — Z3202 Encounter for pregnancy test, result negative: Secondary | ICD-10-CM | POA: Diagnosis not present

## 2024-02-09 DIAGNOSIS — N809 Endometriosis, unspecified: Secondary | ICD-10-CM | POA: Diagnosis not present

## 2024-02-09 DIAGNOSIS — R1013 Epigastric pain: Secondary | ICD-10-CM | POA: Insufficient documentation

## 2024-02-09 LAB — URINALYSIS, ROUTINE W REFLEX MICROSCOPIC
Bacteria, UA: NONE SEEN
Bilirubin Urine: NEGATIVE
Glucose, UA: NEGATIVE mg/dL
Ketones, ur: 5 mg/dL — AB
Leukocytes,Ua: NEGATIVE
Nitrite: NEGATIVE
Protein, ur: 30 mg/dL — AB
Specific Gravity, Urine: >1.046 — ABNORMAL HIGH (ref 1.005–1.030)
pH: 6 (ref 5.0–8.0)

## 2024-02-09 LAB — POCT URINALYSIS DIP (MANUAL ENTRY)
Glucose, UA: NEGATIVE mg/dL
Leukocytes, UA: NEGATIVE
Nitrite, UA: NEGATIVE
Protein Ur, POC: 100 mg/dL — AB
Spec Grav, UA: 1.02 (ref 1.010–1.025)
Urobilinogen, UA: 2 U/dL — AB
pH, UA: 6 (ref 5.0–8.0)

## 2024-02-09 LAB — COMPREHENSIVE METABOLIC PANEL WITH GFR
ALT: 19 U/L (ref 0–44)
AST: 25 U/L (ref 15–41)
Albumin: 4.2 g/dL (ref 3.5–5.0)
Alkaline Phosphatase: 78 U/L (ref 38–126)
Anion gap: 13 (ref 5–15)
BUN: 12 mg/dL (ref 6–20)
CO2: 23 mmol/L (ref 22–32)
Calcium: 9.5 mg/dL (ref 8.9–10.3)
Chloride: 100 mmol/L (ref 98–111)
Creatinine, Ser: 0.93 mg/dL (ref 0.44–1.00)
GFR, Estimated: >60 mL/min (ref >60)
Glucose, Bld: 89 mg/dL (ref 70–99)
Potassium: 3.7 mmol/L (ref 3.5–5.1)
Sodium: 136 mmol/L (ref 135–145)
Total Bilirubin: 1 mg/dL (ref 0.0–1.2)
Total Protein: 8.3 g/dL — ABNORMAL HIGH (ref 6.5–8.1)

## 2024-02-09 LAB — HCG, SERUM, QUALITATIVE: Preg, Serum: NEGATIVE

## 2024-02-09 LAB — CBC WITH DIFFERENTIAL/PLATELET
Abs Immature Granulocytes: 0.03 K/uL (ref 0.00–0.07)
Basophils Absolute: 0 K/uL (ref 0.0–0.1)
Basophils Relative: 0 %
Eosinophils Absolute: 0 K/uL (ref 0.0–0.5)
Eosinophils Relative: 0 %
HCT: 40.6 % (ref 36.0–46.0)
Hemoglobin: 13.6 g/dL (ref 12.0–15.0)
Immature Granulocytes: 0 %
Lymphocytes Relative: 28 %
Lymphs Abs: 2.8 K/uL (ref 0.7–4.0)
MCH: 28.8 pg (ref 26.0–34.0)
MCHC: 33.5 g/dL (ref 30.0–36.0)
MCV: 85.8 fL (ref 80.0–100.0)
Monocytes Absolute: 0.7 K/uL (ref 0.1–1.0)
Monocytes Relative: 8 %
Neutro Abs: 6.2 K/uL (ref 1.7–7.7)
Neutrophils Relative %: 64 %
Platelets: 180 K/uL (ref 150–400)
RBC: 4.73 MIL/uL (ref 3.87–5.11)
RDW: 13.3 % (ref 11.5–15.5)
WBC: 9.8 K/uL (ref 4.0–10.5)
nRBC: 0 % (ref 0.0–0.2)

## 2024-02-09 LAB — POCT URINE PREGNANCY: Preg Test, Ur: NEGATIVE

## 2024-02-09 LAB — LIPASE, BLOOD: Lipase: 42 U/L (ref 11–51)

## 2024-02-09 MED ORDER — ONDANSETRON HCL 4 MG/2ML IJ SOLN
4.0000 mg | Freq: Once | INTRAMUSCULAR | Status: AC
Start: 2024-02-09 — End: 2024-02-09
  Administered 2024-02-09: 4 mg via INTRAVENOUS
  Filled 2024-02-09: qty 2

## 2024-02-09 MED ORDER — ONDANSETRON 4 MG PO TBDP
4.0000 mg | ORAL_TABLET | Freq: Once | ORAL | Status: AC
  Administered 2024-02-09: 4 mg via ORAL

## 2024-02-09 MED ORDER — IOHEXOL 350 MG/ML SOLN
75.0000 mL | Freq: Once | INTRAVENOUS | Status: AC | PRN
  Administered 2024-02-09: 75 mL via INTRAVENOUS

## 2024-02-09 MED ORDER — ACETAMINOPHEN 325 MG PO TABS
650.0000 mg | ORAL_TABLET | Freq: Once | ORAL | Status: AC
  Administered 2024-02-09: 650 mg via ORAL
  Filled 2024-02-09: qty 2

## 2024-02-09 MED ORDER — ONDANSETRON 4 MG PO TBDP
ORAL_TABLET | ORAL | Status: AC
  Filled 2024-02-09: qty 1

## 2024-02-09 MED ORDER — ALUM & MAG HYDROXIDE-SIMETH 200-200-20 MG/5ML PO SUSP
ORAL | Status: AC
  Filled 2024-02-09: qty 30

## 2024-02-09 MED ORDER — ONDANSETRON HCL 4 MG PO TABS: 4.0000 mg | ORAL_TABLET | Freq: Four times a day (QID) | ORAL | 0 refills | Status: AC

## 2024-02-09 MED ORDER — ONDANSETRON HCL 4 MG/2ML IJ SOLN: 4.0000 mg | Freq: Once | INTRAMUSCULAR | Status: DC

## 2024-02-09 MED ORDER — ALUM & MAG HYDROXIDE-SIMETH 200-200-20 MG/5ML PO SUSP
30.0000 mL | Freq: Once | ORAL | Status: AC
  Administered 2024-02-09: 30 mL via ORAL

## 2024-02-09 MED ORDER — LIDOCAINE VISCOUS HCL 2 % MT SOLN
OROMUCOSAL | Status: AC
  Filled 2024-02-09: qty 15

## 2024-02-09 MED ORDER — ONDANSETRON HCL 4 MG/2ML IJ SOLN
INTRAMUSCULAR | Status: AC
  Filled 2024-02-09: qty 2

## 2024-02-09 MED ORDER — LIDOCAINE VISCOUS HCL 2 % MT SOLN
15.0000 mL | Freq: Once | OROMUCOSAL | Status: AC
  Administered 2024-02-09: 15 mL via OROMUCOSAL

## 2024-02-09 MED ORDER — ONDANSETRON HCL 4 MG/2ML IJ SOLN
4.0000 mg | Freq: Once | INTRAMUSCULAR | Status: AC
  Administered 2024-02-09: 4 mg via INTRAVENOUS

## 2024-02-09 NOTE — ED Provider Triage Note (Signed)
 Emergency Medicine Provider Triage Evaluation Note  Sarah Cabrera , a 23 y.o. female  was evaluated in triage.  Pt complains of abdominal pain has been persistent over the last 3 days, has coincided with the onset of menses 3 days ago as well.  She has a history of endometriosis, however is unable to state knowledge of where the endometriosis has been identified.  Current location of pain is in the upper abdomen along with persistent nausea.  Patient also endorses regular use of cannabis.  Says she smokes approximately twice weekly and this is been routine for more than a year.  Has been able to tolerate p.o. intake since yesterday.  Review of Systems  Positive: As above Negative:   Physical Exam  BP 117/72   Pulse 62   Temp 98.2 F (36.8 C)   Resp 18   Ht 5' 5 (1.651 m)   Wt 63.5 kg   LMP 02/06/2024 (Exact Date)   SpO2 99%   BMI 23.30 kg/m  Gen:   Awake, no distress   Resp:  Normal effort  MSK:   Moves extremities without difficulty  Other:  Abdomen is diffusely tender  Medical Decision Making  Medically screening exam initiated at 2:33 PM.  Appropriate orders placed.  Octa Uplinger was informed that the remainder of the evaluation will be completed by another provider, this initial triage assessment does not replace that evaluation, and the importance of remaining in the ED until their evaluation is complete.  Abdominal pain workup initiated with imaging of the abdomen to assess for presence of endometriosis or other intra-abdominal pathology.   Myriam Dorn BROCK[place], [place] 02/09/24 [phone]

## 2024-02-09 NOTE — Discharge Instructions (Signed)
Please go to the emergency room for further evaluation and treatment.

## 2024-02-09 NOTE — Medical Student Note (Incomplete)
 [facility] Insurance account manager Note For educational purposes for Medical, PA and NP students only and not part of the legal medical record.   CSN: [id number] Arrival date & time: 02/09/24  1018      History   Chief Complaint Chief Complaint  Patient presents with  . Emesis  . Abdominal Pain    HPI Sarah Cabrera is a 23 y.o. female.  Patient presents today for N/V and abdominal pain for 3 days.  Endorses nausea and 3 episodes of vomiting today w/ clear yellow emesis. States she is unable to keep anything down.  Abdominal pain is constant.  No known sick contacts. Sx unrelieved by zofran . Denies chest pain, shortness of breath, diarrhea, cough, fever.     Emesis Associated symptoms: abdominal pain   Abdominal Pain Associated symptoms: vomiting     Past Medical History:  Diagnosis Date  . Pelvic pain   . Wears glasses     Patient Active Problem List   Diagnosis Date Noted  . Generalized abdominal pain 10/25/2023    Past Surgical History:  Procedure Laterality Date  . APPENDECTOMY     age 89  . LAPAROSCOPY N/A 02/25/2022   Procedure: LAPAROSCOPY DIAGNOSTIC;  Surgeon: Horacio Boas, MD;  [facility]  Service: Gynecology;  Laterality: N/A;  . LAPAROSCOPY N/A 02/25/2022   Procedure: LAPAROSCOPY OPERATIVE, FULGURATION OF POSSIBLE ENDOMETRIOSIS;  Surgeon: Horacio Boas, MD;  Location: [facility];  Service: Gynecology;  Laterality: N/A;    OB History   No obstetric history on file.      Home Medications    Prior to Admission medications   Medication Sig Start Date End Date Taking? Authorizing Provider  VIENVA 0.1-20 MG-MCG tablet Take 1 tablet by mouth daily. 10/14/23  Yes [provider]    Family History Family History  Problem Relation Age of Onset  . Healthy Mother   . Cancer Father     Social History Social History   Tobacco Use  . Smoking status: Never  . Smokeless tobacco: Never   Vaping Use  . Vaping status: Never Used  Substance Use Topics  . Alcohol use: Never  . Drug use: Yes    Types: Marijuana     Allergies   Morphine    Review of Systems Review of Systems  Gastrointestinal:  Positive for abdominal pain and vomiting.     Physical Exam Updated Vital Signs BP 108/70 (BP Location: Right Arm)   Pulse (!) 56   Temp 98.1 F (36.7 C) (Oral)   Resp 18   LMP 02/06/2024 (Exact Date)   SpO2 99%   Physical Exam   ED Treatments / Results  Labs (all labs ordered are listed, but only abnormal results are displayed) Labs Reviewed  POCT URINALYSIS DIP (MANUAL ENTRY) - Abnormal; Notable for the following components:      Result Value   Color, UA brown (*)    Clarity, UA hazy (*)    Bilirubin, UA moderate (*)    Ketones, POC UA large (80) (*)    Blood, UA large (*)    Protein Ur, POC =100 (*)    Urobilinogen, UA 2.0 (*)    All other components within normal limits  POCT URINE PREGNANCY - Normal    EKG  Radiology No results found.  Procedures Procedures (including critical care time)  Medications Ordered in ED Medications  ondansetron  (ZOFRAN -ODT) disintegrating tablet 4 mg (4 mg Oral Given 02/09/24 1026)  Initial Impression / Assessment and Plan / ED Course  I have reviewed the triage vital signs and the nursing notes.  Pertinent labs & imaging results that were available during my care of the patient were reviewed by me and considered in my medical decision making (see chart for details).     ***  Final Clinical Impressions(s) / ED Diagnoses   Final diagnoses:  None    New Prescriptions New Prescriptions   No medications on file

## 2024-02-09 NOTE — ED Notes (Signed)
 Patient transported to CT

## 2024-02-09 NOTE — ED Provider Notes (Signed)
 [facility] Provider Note   CSN: [id number] Arrival date & time: 02/09/24  1234     Patient presents with: Emesis and Abdominal Pain   Sarah Cabrera is a 23 y.o. female.   Emesis Associated symptoms: abdominal pain   Abdominal Pain Associated symptoms: vomiting   Patient is a 23 year old female presented to ED today with concerns for epigastric abdominal pain that began 2 days ago and accompanied with nausea and vomiting saying that she had used THC day of and the following days.  Medical history of reported endometriosis and is currently being evaluated by OB/GYN according to patient.  LMP started 3 days ago and is currently spotting and on birth control, with normal bleeding.  Currently is asymptomatic at this time with patient reporting that the abdominal pain is gone as well as no reported nausea.  However did report that she had some mild low back pain before she came to the ED bilaterally.  Denies fever, headache, chest pain, shortness of breath, hematemesis, melena, hematochezia, diarrhea, dysuria, vaginal discharge, vaginal pain, lower leg swelling, saddle paresthesias, IVD use, alcohol use, weight loss.     Prior to Admission medications   Medication Sig Start Date End Date Taking? Authorizing Provider  ondansetron  (ZOFRAN ) 4 MG tablet Take 1 tablet (4 mg total) by mouth every 6 (six) hours. 02/09/24  Yes Royce Sciara S, PA-C  VIENVA 0.1-20 MG-MCG tablet Take 1 tablet by mouth daily. 10/14/23   [provider]    Allergies: Morphine     Review of Systems  Gastrointestinal:  Positive for abdominal pain and vomiting.  All other systems reviewed and are negative.   Updated Vital Signs BP 112/73   Pulse 64   Temp 98.3 F (36.8 C)   Resp 12   Ht 5' 5 (1.651 m)   Wt 63.5 kg   LMP 02/06/2024 (Exact Date)   SpO2 100%   BMI 23.30 kg/m   Physical Exam Vitals and nursing note reviewed.  Constitutional:       General: She is not in acute distress.    Appearance: Normal appearance. She is not ill-appearing, toxic-appearing or diaphoretic.  HENT:     Head: Normocephalic and atraumatic.  Eyes:     General: No scleral icterus.       Right eye: No discharge.        Left eye: No discharge.     Extraocular Movements: Extraocular movements intact.     Conjunctiva/sclera: Conjunctivae normal.  Cardiovascular:     Rate and Rhythm: Normal rate and regular rhythm.     Pulses: Normal pulses.     Heart sounds: Normal heart sounds. No murmur heard.    No friction rub. No gallop.  Pulmonary:     Effort: Pulmonary effort is normal. No respiratory distress.     Breath sounds: Normal breath sounds. No stridor. No wheezing, rhonchi or rales.  Abdominal:     General: Abdomen is flat. There is no distension.     Palpations: Abdomen is soft. There is no pulsatile mass.     Tenderness: There is no abdominal tenderness. There is no right CVA tenderness, left CVA tenderness, guarding or rebound. Negative signs include Murphy's sign, Rovsing's sign, McBurney's sign, psoas sign and obturator sign.  Musculoskeletal:     Cervical back: Normal range of motion and neck supple. No rigidity.     Right lower leg: No edema.     Left lower leg: No edema.  Skin:    General: Skin is warm and dry.     Coloration: Skin is not mottled or pale.     Findings: No erythema.  Neurological:     General: No focal deficit present.     Mental Status: She is alert and oriented to person, place, and time. Mental status is at baseline.     Cranial Nerves: No cranial nerve deficit.     Motor: No weakness.  Psychiatric:        Mood and Affect: Mood normal. Mood is not anxious.     (all labs ordered are listed, but only abnormal results are displayed) Labs Reviewed  COMPREHENSIVE METABOLIC PANEL WITH GFR - Abnormal; Notable for the following components:      Result Value   Total Protein 8.3 (*)    All other components within normal  limits  URINALYSIS, ROUTINE W REFLEX MICROSCOPIC - Abnormal; Notable for the following components:   Specific Gravity, Urine >1.046 (*)    Hgb urine dipstick MODERATE (*)    Ketones, ur 5 (*)    Protein, ur 30 (*)    All other components within normal limits  LIPASE, BLOOD  CBC WITH DIFFERENTIAL/PLATELET  HCG, SERUM, QUALITATIVE    EKG: None  Radiology: CT ABDOMEN PELVIS W CONTRAST Result Date: 02/09/2024 CLINICAL DATA:  Endometriosis. EXAM: CT ABDOMEN AND PELVIS WITH CONTRAST TECHNIQUE: Multidetector CT imaging of the abdomen and pelvis was performed using the standard protocol following bolus administration of intravenous contrast. RADIATION DOSE REDUCTION: This exam was performed according to the departmental dose-optimization program which includes automated exposure control, adjustment of the mA and/or kV according to patient size and/or use of iterative reconstruction technique. CONTRAST:  75mL OMNIPAQUE  IOHEXOL  350 MG/ML SOLN COMPARISON:  September 09, 2021. FINDINGS: Lower chest: No acute abnormality. Hepatobiliary: No focal liver abnormality is seen. No gallstones, gallbladder wall thickening, or biliary dilatation. Pancreas: Unremarkable. No pancreatic ductal dilatation or surrounding inflammatory changes. Spleen: Normal in size without focal abnormality. Adrenals/Urinary Tract: Adrenal glands are unremarkable. Kidneys are normal, without renal calculi, focal lesion, or hydronephrosis. Bladder is unremarkable. Stomach/Bowel: The stomach is unremarkable. There is no evidence of bowel obstruction or inflammation. Status post appendectomy. Vascular/Lymphatic: No significant vascular findings are present. No enlarged abdominal or pelvic lymph nodes. Reproductive: Uterus and bilateral adnexa are unremarkable. Other: No ascites or hernia. Musculoskeletal: No acute or significant osseous findings. IMPRESSION: No acute abnormality seen in the abdomen or pelvis. Electronically Signed   By: Lynwood Landy Raddle M.D.   On: 02/09/2024 17:36     Procedures   Medications Ordered in the ED  ondansetron  (ZOFRAN ) injection 4 mg (4 mg Intravenous Given 02/09/24 1552)  iohexol  (OMNIPAQUE ) 350 MG/ML injection 75 mL (75 mLs Intravenous Contrast Given 02/09/24 1632)    Medical Decision Making Risk Prescription drug management.   This patient is a 23 year old female who presents to the ED for concern of epigastric pain, nausea, vomiting x 2 days with reported THC use.  History of endometriosis and currently being seen by OB/GYN.  Seen by urgent care today and was told to come to the ED for evaluation.  Currently asymptomatic at this time with having already been provided Zofran .  On physical exam, patient is in no acute distress, afebrile, alert and orient x 4, speaking in full sentences, nontachypneic, nontachycardic.  Notably does not have any abdominal tenderness nor CVA tenderness bilaterally.  LCTAB, RRR, no murmur, no lower leg edema, unremarkable physical exam.  Patient's labs  and imaging were ordered in triage.  Labs were unremarkable.  Low suspicion for any emergent causes such as ACS, PE, cholecystitis, cholangitis, pneumonia, PUD.  Suspect likely cannabinoid hyperemesis with resolution of symptoms from Zofran .  CT scan unremarkable.  Patient still asymptomatic at this time.  Urine does show some dehydration and will have patient continue to orally hydrate.  Patient has been tolerating p.o. fluids for some time on reevaluation.  Believe that she is safe to discharge this time with Zofran  as additional nausea relief.  Will have her follow-up with PCP and OB/GYN.  Patient vital signs have remained stable throughout the course of patient's time in the ED. Low suspicion for any other emergent pathology at this time. I believe this patient is safe to be discharged. Provided strict return to ER precautions. Patient expressed agreement and understanding of plan. All questions were  answered.  Differential diagnoses prior to evaluation: The emergent differential diagnosis includes, but is not limited to, aortic dissection, ACS, cholecystitis, cholangitis, GERD, pancreatitis, pneumonia, PUD, gastritis, gastroenteritis, PE. This is not an exhaustive differential.   Past Medical History / Co-morbidities / Social History: Endometriosis, stenosis appendectomy  Additional history: Chart reviewed. Pertinent results include:   Seen today at urgent care for epigastric pain with nausea and vomiting.  Told to come to the ED for further evaluation after providing GI cocktail and Zofran  which helped relieve nausea.  Lab Tests/Imaging studies: I personally interpreted labs/imaging and the pertinent results include:   CBC unremarkable CMP unremarkable Lipase unremarkable hCG qualitative negative UA notes ketones and elevated specific gravity, likely secondary to dehydration CT scan of abdomen unremarkable I agree with the radiologist interpretation.    Medications: I ordered medication including Zofran .  I have reviewed the patients home medicines and have made adjustments as needed.  Critical Interventions: None  Social Determinants of Health: Notably is good follow-up with OB/GYN as well as with her PCP  Disposition: After consideration of the diagnostic results and the patients response to treatment, I feel that the patient would benefit from discharge and treatment as above.   emergency department workup does not suggest an emergent condition requiring admission or immediate intervention beyond what has been performed at this time. The plan is: Follow-up with PCP/OB/GYN, and Zofran  for nausea, return to the ED for new or worsening symptoms. The patient is safe for discharge and has been instructed to return immediately for worsening symptoms, change in symptoms or any other concerns.   Final diagnoses:  Nausea and vomiting, unspecified vomiting type    ED Discharge  Orders          Ordered    ondansetron  (ZOFRAN ) 4 MG tablet  Every 6 hours        02/09/24 1747               Josely Moffat S, PA-C 02/09/24 1804    Sarah Cabrera CROME, MD 02/09/24 2015

## 2024-02-09 NOTE — ED Notes (Signed)
 Patient is being discharged from the Urgent Care and sent to the Emergency Department via private vehicle . Per Dr Vonna, patient is in need of higher level of care due to abdominal pain, vomiting. Patient is aware and verbalizes understanding of plan of care.  Vitals:   02/09/24 1022  BP: 108/70  Pulse: (!) 56  Resp: 18  Temp: 98.1 F (36.7 C)  SpO2: 99%

## 2024-02-09 NOTE — ED Provider Notes (Signed)
 [facility]    CSN: [id number] Arrival date & time: 02/09/24  1018      History   Chief Complaint Chief Complaint  Patient presents with   Emesis   Abdominal Pain    HPI Sarah Cabrera is a 23 y.o. female.    Emesis Associated symptoms: abdominal pain   Abdominal Pain Associated symptoms: vomiting   Here for epigastric abdominal pain and nausea and vomiting.  Symptoms began on July 21.  No fever or chills.  She threw up all day yesterday and has thrown up already 3 times this morning.  Last bowel movement was July 22 and normal.  No blood in the emesis.  She started her cycle July 21.  At 1 point she stated that Zofran  at home and not helped, but she also at first said that she had taken Adderall for her cramps, and it turned out that was actually Midol .  She is allergic to morphine  She did smoke marijuana this morning. Past Medical History:  Diagnosis Date   Pelvic pain    Wears glasses     Patient Active Problem List   Diagnosis Date Noted   Generalized abdominal pain 10/25/2023    Past Surgical History:  Procedure Laterality Date   APPENDECTOMY     age 40   LAPAROSCOPY N/A 02/25/2022   Procedure: LAPAROSCOPY DIAGNOSTIC;  Surgeon: Horacio Boas, MD;  Location: [facility];  Service: Gynecology;  Laterality: N/A;   LAPAROSCOPY N/A 02/25/2022   Procedure: LAPAROSCOPY OPERATIVE, FULGURATION OF POSSIBLE ENDOMETRIOSIS;  Surgeon: Horacio Boas, MD;  Location: [facility];  Service: Gynecology;  Laterality: N/A;    OB History   No obstetric history on file.      Home Medications    Prior to Admission medications   Medication Sig Start Date End Date Taking? Authorizing Provider  VIENVA 0.1-20 MG-MCG tablet Take 1 tablet by mouth daily. 10/14/23  Yes [provider]    Family History Family History  Problem Relation Age of Onset   Healthy Mother    Cancer Father     Social History Social History    Tobacco Use   Smoking status: Never   Smokeless tobacco: Never  Vaping Use   Vaping status: Never Used  Substance Use Topics   Alcohol use: Never   Drug use: Yes    Types: Marijuana     Allergies   Morphine    Review of Systems Review of Systems  Gastrointestinal:  Positive for abdominal pain and vomiting.     Physical Exam Triage Vital Signs ED Triage Vitals  Encounter Vitals Group     BP 02/09/24 1022 108/70     Girls Systolic BP Percentile --      Girls Diastolic BP Percentile --      Boys Systolic BP Percentile --      Boys Diastolic BP Percentile --      Pulse Rate 02/09/24 1022 (!) 56     Resp 02/09/24 1022 18     Temp 02/09/24 1022 98.1 F (36.7 C)     Temp Source 02/09/24 1022 Oral     SpO2 02/09/24 1022 99 %     Weight --      Height --      Head Circumference --      Peak Flow --      Pain Score 02/09/24 1019 8     Pain Loc --      Pain Education --  Exclude from Growth Chart --    No data found.  Updated Vital Signs BP 108/70 (BP Location: Right Arm)   Pulse (!) 56   Temp 98.1 F (36.7 C) (Oral)   Resp 18   LMP 02/06/2024 (Exact Date)   SpO2 99%   Visual Acuity Right Eye Distance:   Left Eye Distance:   Bilateral Distance:    Right Eye Near:   Left Eye Near:    Bilateral Near:     Physical Exam Vitals reviewed.  Constitutional:      General: She is not in acute distress.    Appearance: She is not ill-appearing, toxic-appearing or diaphoretic.  HENT:     Mouth/Throat:     Mouth: Mucous membranes are moist.  Cardiovascular:     Rate and Rhythm: Normal rate and regular rhythm.     Heart sounds: No murmur heard. Pulmonary:     Effort: Pulmonary effort is normal.     Breath sounds: Normal breath sounds.  Abdominal:     General: Bowel sounds are normal. There is no distension.     Palpations: Abdomen is soft.     Tenderness: There is abdominal tenderness (There is epigastric tenderness.). There is no guarding.   Musculoskeletal:     Cervical back: Neck supple.  Lymphadenopathy:     Cervical: No cervical adenopathy.  Skin:    Coloration: Skin is not jaundiced or pale.  Neurological:     General: No focal deficit present.     Mental Status: She is alert and oriented to person, place, and time.  Psychiatric:        Behavior: Behavior normal.      UC Treatments / Results  Labs (all labs ordered are listed, but only abnormal results are displayed) Labs Reviewed  POCT URINALYSIS DIP (MANUAL ENTRY) - Abnormal; Notable for the following components:      Result Value   Color, UA brown (*)    Clarity, UA hazy (*)    Bilirubin, UA moderate (*)    Ketones, POC UA large (80) (*)    Blood, UA large (*)    Protein Ur, POC =100 (*)    Urobilinogen, UA 2.0 (*)    All other components within normal limits  POCT URINE PREGNANCY - Normal    EKG   Radiology No results found.  Procedures Procedures (including critical care time)  Medications Ordered in UC Medications  ondansetron  (ZOFRAN -ODT) disintegrating tablet 4 mg (4 mg Oral Given 02/09/24 1026)  alum & mag hydroxide-simeth (MAALOX/MYLANTA) 200-200-20 MG/5ML suspension 30 mL (30 mLs Oral Given 02/09/24 1152)  lidocaine  (XYLOCAINE ) 2 % viscous mouth solution 15 mL (15 mLs Mouth/Throat Given 02/09/24 1152)    Initial Impression / Assessment and Plan / UC Course  I have reviewed the triage vital signs and the nursing notes.  Pertinent labs & imaging results that were available during my care of the patient were reviewed by me and considered in my medical decision making (see chart for details).     She was given ondansetron  ODT at triage and by the time I got to see her that it helped her nausea some.  GI cocktail was then given  There is only minimal pain relief with the GI cocktail.  We discussed options and she is decided to present to the emergency room for further evaluation and treatment as she is still symptomatic.  She will go  by private vehicle.  Final Clinical Impressions(s) / UC Diagnoses  Final diagnoses:  Epigastric pain  Nausea and vomiting, unspecified vomiting type     Discharge Instructions      Please go to the emergency room for further evaluation and treatment.     ED Prescriptions   None    PDMP not reviewed this encounter.   Vonna Sharlet POUR, MD 02/09/24 [phone]

## 2024-02-09 NOTE — Discharge Instructions (Addendum)
 You were seen today for abdominal pain and nausea/vomiting for the last 2 days. Your exam, labs, imaging today were reassuring I low suspicion for any emergent cause of your symptoms today.  It appears that you are slightly dehydrated, recommend continue to eat and drink.  I am prescribing you a nausea medication that you provided today that helped with your nausea.  Please take this every 6-8 hours as needed.  Please return to follow-up with your PCP as well as your OB/GYN.  Return to ED if you need to be new or worsening symptoms.

## 2024-02-09 NOTE — ED Triage Notes (Signed)
 Pt here for n/v/abd pain for 2 days. Hx of endometriosis. Denies fevers and diarrhea. Pt started period 3 days ago.

## 2024-02-09 NOTE — ED Triage Notes (Signed)
 Pt states she has severe abdominal pain and vomiting X 3 days. She states she is on her cycle currently. She is taking midol . Smoked Marijuana this morning.

## 2024-07-17 ENCOUNTER — Encounter (HOSPITAL_COMMUNITY): Payer: Self-pay

## 2024-07-17 ENCOUNTER — Emergency Department (HOSPITAL_COMMUNITY)
Admission: EM | Admit: 2024-07-17 | Discharge: 2024-07-17 | Discharge status: Against Medical Advice | Attending: Emergency Medicine | Admitting: Emergency Medicine

## 2024-07-17 ENCOUNTER — Other Ambulatory Visit: Payer: Self-pay

## 2024-07-17 ENCOUNTER — Emergency Department (HOSPITAL_COMMUNITY)

## 2024-07-17 DIAGNOSIS — R112 Nausea with vomiting, unspecified: Secondary | ICD-10-CM | POA: Insufficient documentation

## 2024-07-17 DIAGNOSIS — R509 Fever, unspecified: Secondary | ICD-10-CM | POA: Diagnosis not present

## 2024-07-17 DIAGNOSIS — R079 Chest pain, unspecified: Secondary | ICD-10-CM | POA: Diagnosis not present

## 2024-07-17 DIAGNOSIS — Z5321 Procedure and treatment not carried out due to patient leaving prior to being seen by health care provider: Secondary | ICD-10-CM | POA: Insufficient documentation

## 2024-07-17 LAB — CK: Total CK: 240 U/L — ABNORMAL HIGH (ref 38–234)

## 2024-07-17 LAB — URINALYSIS, ROUTINE W REFLEX MICROSCOPIC
Bilirubin Urine: NEGATIVE
Glucose, UA: NEGATIVE mg/dL
Ketones, ur: 5 mg/dL — AB
Leukocytes,Ua: NEGATIVE
Nitrite: NEGATIVE
Protein, ur: 100 mg/dL — AB
Specific Gravity, Urine: 1.033 — ABNORMAL HIGH (ref 1.005–1.030)
pH: 7 (ref 5.0–8.0)

## 2024-07-17 LAB — COMPREHENSIVE METABOLIC PANEL WITH GFR
ALT: 23 U/L (ref 0–44)
AST: 28 U/L (ref 15–41)
Albumin: 4.3 g/dL (ref 3.5–5.0)
Alkaline Phosphatase: 79 U/L (ref 38–126)
Anion gap: 14 (ref 5–15)
BUN: 9 mg/dL (ref 6–20)
CO2: 22 mmol/L (ref 22–32)
Calcium: 9.6 mg/dL (ref 8.9–10.3)
Chloride: 103 mmol/L (ref 98–111)
Creatinine, Ser: 0.8 mg/dL (ref 0.44–1.00)
GFR, Estimated: >60 mL/min
Glucose, Bld: 96 mg/dL (ref 70–99)
Potassium: 3.5 mmol/L (ref 3.5–5.1)
Sodium: 138 mmol/L (ref 135–145)
Total Bilirubin: 0.5 mg/dL (ref 0.0–1.2)
Total Protein: 7.5 g/dL (ref 6.5–8.1)

## 2024-07-17 LAB — CBC WITH DIFFERENTIAL/PLATELET
Abs Immature Granulocytes: 0.03 K/uL (ref 0.00–0.07)
Basophils Absolute: 0 K/uL (ref 0.0–0.1)
Basophils Relative: 0 %
Eosinophils Absolute: 0 K/uL (ref 0.0–0.5)
Eosinophils Relative: 0 %
HCT: 36.9 % (ref 36.0–46.0)
Hemoglobin: 12.4 g/dL (ref 12.0–15.0)
Immature Granulocytes: 0 %
Lymphocytes Relative: 20 %
Lymphs Abs: 2.3 K/uL (ref 0.7–4.0)
MCH: 29 pg (ref 26.0–34.0)
MCHC: 33.6 g/dL (ref 30.0–36.0)
MCV: 86.4 fL (ref 80.0–100.0)
Monocytes Absolute: 0.9 K/uL (ref 0.1–1.0)
Monocytes Relative: 7 %
Neutro Abs: 8.5 K/uL — ABNORMAL HIGH (ref 1.7–7.7)
Neutrophils Relative %: 73 %
Platelets: 183 K/uL (ref 150–400)
RBC: 4.27 MIL/uL (ref 3.87–5.11)
RDW: 14.3 % (ref 11.5–15.5)
WBC: 11.7 K/uL — ABNORMAL HIGH (ref 4.0–10.5)
nRBC: 0 % (ref 0.0–0.2)

## 2024-07-17 LAB — TROPONIN T, HIGH SENSITIVITY: Troponin T High Sensitivity: <15 ng/L (ref 0–19)

## 2024-07-17 LAB — RESP PANEL BY RT-PCR (RSV, FLU A&B, COVID)  RVPGX2
Influenza A by PCR: NEGATIVE
Influenza B by PCR: NEGATIVE
Resp Syncytial Virus by PCR: NEGATIVE
SARS Coronavirus 2 by RT PCR: NEGATIVE

## 2024-07-17 LAB — LIPASE, BLOOD: Lipase: 13 U/L (ref 11–51)

## 2024-07-17 LAB — HCG, SERUM, QUALITATIVE: Preg, Serum: NEGATIVE

## 2024-07-17 MED ORDER — IBUPROFEN 400 MG PO TABS
600.0000 mg | ORAL_TABLET | Freq: Once | ORAL | Status: AC
  Administered 2024-07-17: 600 mg via ORAL
  Filled 2024-07-17: qty 1

## 2024-07-17 MED ORDER — ACETAMINOPHEN 500 MG PO TABS
1000.0000 mg | ORAL_TABLET | Freq: Once | ORAL | Status: AC
  Administered 2024-07-17: 1000 mg via ORAL
  Filled 2024-07-17: qty 2

## 2024-07-17 MED ORDER — ONDANSETRON 4 MG PO TBDP
4.0000 mg | ORAL_TABLET | Freq: Once | ORAL | Status: AC
  Administered 2024-07-17: 4 mg via ORAL
  Filled 2024-07-17: qty 1

## 2024-07-17 NOTE — ED Triage Notes (Signed)
 PT arrives via POV. PT c/o abdominal pain, nausea, vomiting, and chest pain since yesterday.

## 2024-07-17 NOTE — ED Notes (Signed)
 Encouraged pt to stay, not willing, leaving because of long wait times. Observed walking out of ED

## 2024-07-17 NOTE — ED Provider Triage Note (Signed)
 Emergency Medicine Provider Triage Evaluation Note  Sarah Cabrera , a 23 y.o. female  was evaluated in triage.  Pt complains of multiple complaints.  Patient reports that yesterday morning started to feel nauseous with vomiting.  Then after multiple vomiting episodes she started having some chest pain that described as burning.  No shortness of breath.  Denies any coffee-ground emesis or bloody emesis.  Denies any urinary or bowel movement changes/symptoms.  She presented today now she feels little achy in her abdomen.  Reports she had a fever Tmax 101 today.  No known sick contacts..  Review of Systems  Positive:  Negative:   Physical Exam  BP 122/76   Pulse 72   Temp 99.2 F (37.3 C)   Resp 19   Ht 5' 5 (1.651 m)   Wt 61.2 kg   LMP 07/03/2024 (Approximate)   SpO2 98%   BMI 22.47 kg/m  Gen:   Awake, no distress   Resp:  Normal effort  MSK:   Moves extremities without difficulty  Other:  Very mild abdominal tender palpation.  Soft  Medical Decision Making  Medically screening exam initiated at 2:32 PM.  Appropriate orders placed.  Sarah Cabrera was informed that the remainder of the evaluation will be completed by another provider, this initial triage assessment does not replace that evaluation, and the importance of remaining in the ED until their evaluation is complete.  Will order labs and trial with Zofran .  If advanced imaging is needed, can be determined in the main emergency department.  Vital signs are stable.   Sarah Ernst, PA-C 07/17/24 1438

## 2024-07-17 NOTE — ED Triage Notes (Signed)
 Patient states that she started vomiting yesterday and then her abdomen and chest started hurting.  She reports that the abdominal pain is in the upper middle of the abdomen, chest pain is on the right side and does not radiate.  Reports a fever earlier today and she took Midol .  She denies having shob or diarrhea.

## 2024-07-17 NOTE — ED Notes (Signed)
 Was unable to get a clear EKG due to patient not able to stop moving around, breathing heavy and crying due to pain
